# Patient Record
Sex: Female | Born: 1975
Health system: Southern US, Community
[De-identification: ages and names within clinical notes are randomized; demographics above are authoritative.]

## PROBLEM LIST (undated history)

## (undated) DIAGNOSIS — R011 Cardiac murmur, unspecified: Secondary | ICD-10-CM

## (undated) DIAGNOSIS — T7840XA Allergy, unspecified, initial encounter: Secondary | ICD-10-CM

## (undated) HISTORY — DX: Cardiac murmur, unspecified: R01.1

## (undated) HISTORY — PX: APPENDECTOMY: SHX54

## (undated) HISTORY — DX: Allergy, unspecified, initial encounter: T78.40XA

---

## 2001-10-24 ENCOUNTER — Other Ambulatory Visit: Admission: RE | Admit: 2001-10-24 | Discharge: 2001-10-24 | Payer: Self-pay | Admitting: Obstetrics and Gynecology

## 2002-08-21 ENCOUNTER — Emergency Department (HOSPITAL_COMMUNITY): Admission: EM | Admit: 2002-08-21 | Discharge: 2002-08-21 | Payer: Self-pay | Admitting: Emergency Medicine

## 2002-11-06 ENCOUNTER — Other Ambulatory Visit: Admission: RE | Admit: 2002-11-06 | Discharge: 2002-11-06 | Payer: Self-pay | Admitting: Obstetrics and Gynecology

## 2003-06-22 ENCOUNTER — Emergency Department (HOSPITAL_COMMUNITY): Admission: AD | Admit: 2003-06-22 | Discharge: 2003-06-22 | Payer: Self-pay | Admitting: Family Medicine

## 2003-08-17 ENCOUNTER — Encounter: Admission: RE | Admit: 2003-08-17 | Discharge: 2003-08-17 | Payer: Self-pay | Admitting: Internal Medicine

## 2003-11-25 ENCOUNTER — Other Ambulatory Visit: Admission: RE | Admit: 2003-11-25 | Discharge: 2003-11-25 | Payer: Self-pay | Admitting: Obstetrics and Gynecology

## 2004-05-30 ENCOUNTER — Ambulatory Visit: Payer: Self-pay | Admitting: Internal Medicine

## 2004-12-06 ENCOUNTER — Other Ambulatory Visit: Admission: RE | Admit: 2004-12-06 | Discharge: 2004-12-06 | Payer: Self-pay | Admitting: Obstetrics and Gynecology

## 2004-12-28 ENCOUNTER — Ambulatory Visit (HOSPITAL_COMMUNITY): Admission: RE | Admit: 2004-12-28 | Discharge: 2004-12-28 | Payer: Self-pay | Admitting: Obstetrics and Gynecology

## 2005-07-01 ENCOUNTER — Emergency Department (HOSPITAL_COMMUNITY): Admission: EM | Admit: 2005-07-01 | Discharge: 2005-07-01 | Payer: Self-pay | Admitting: Family Medicine

## 2005-10-23 ENCOUNTER — Ambulatory Visit: Payer: Self-pay | Admitting: Internal Medicine

## 2005-11-16 ENCOUNTER — Ambulatory Visit: Payer: Self-pay | Admitting: Internal Medicine

## 2006-06-22 ENCOUNTER — Ambulatory Visit: Payer: Self-pay | Admitting: Internal Medicine

## 2007-01-31 ENCOUNTER — Ambulatory Visit: Payer: Self-pay | Admitting: Obstetrics & Gynecology

## 2007-02-04 ENCOUNTER — Inpatient Hospital Stay (HOSPITAL_COMMUNITY): Admission: AD | Admit: 2007-02-04 | Discharge: 2007-02-04 | Payer: Self-pay | Admitting: Obstetrics and Gynecology

## 2007-02-04 ENCOUNTER — Ambulatory Visit: Payer: Self-pay | Admitting: Obstetrics and Gynecology

## 2007-02-08 ENCOUNTER — Ambulatory Visit (HOSPITAL_COMMUNITY): Admission: RE | Admit: 2007-02-08 | Discharge: 2007-02-08 | Payer: Self-pay | Admitting: Obstetrics and Gynecology

## 2007-02-08 ENCOUNTER — Ambulatory Visit: Payer: Self-pay | Admitting: Obstetrics and Gynecology

## 2007-02-11 ENCOUNTER — Ambulatory Visit: Payer: Self-pay | Admitting: Obstetrics and Gynecology

## 2007-02-15 ENCOUNTER — Ambulatory Visit (HOSPITAL_COMMUNITY): Admission: RE | Admit: 2007-02-15 | Discharge: 2007-02-15 | Payer: Self-pay | Admitting: Obstetrics and Gynecology

## 2007-02-15 ENCOUNTER — Ambulatory Visit: Payer: Self-pay | Admitting: Obstetrics and Gynecology

## 2007-02-19 ENCOUNTER — Ambulatory Visit: Payer: Self-pay | Admitting: Obstetrics and Gynecology

## 2007-02-22 ENCOUNTER — Ambulatory Visit (HOSPITAL_COMMUNITY): Admission: RE | Admit: 2007-02-22 | Discharge: 2007-02-22 | Payer: Self-pay | Admitting: Obstetrics and Gynecology

## 2007-02-22 ENCOUNTER — Ambulatory Visit: Payer: Self-pay | Admitting: Obstetrics & Gynecology

## 2007-02-25 ENCOUNTER — Ambulatory Visit (HOSPITAL_COMMUNITY): Admission: RE | Admit: 2007-02-25 | Discharge: 2007-02-25 | Payer: Self-pay | Admitting: Obstetrics and Gynecology

## 2007-02-25 ENCOUNTER — Ambulatory Visit: Payer: Self-pay | Admitting: Obstetrics and Gynecology

## 2007-03-01 ENCOUNTER — Ambulatory Visit (HOSPITAL_COMMUNITY): Admission: RE | Admit: 2007-03-01 | Discharge: 2007-03-01 | Payer: Self-pay | Admitting: Obstetrics and Gynecology

## 2007-03-01 ENCOUNTER — Ambulatory Visit: Payer: Self-pay | Admitting: Obstetrics and Gynecology

## 2007-03-06 ENCOUNTER — Encounter: Payer: Self-pay | Admitting: Obstetrics and Gynecology

## 2007-03-06 ENCOUNTER — Inpatient Hospital Stay (HOSPITAL_COMMUNITY): Admission: AD | Admit: 2007-03-06 | Discharge: 2007-03-06 | Payer: Self-pay | Admitting: Obstetrics and Gynecology

## 2007-03-07 ENCOUNTER — Inpatient Hospital Stay (HOSPITAL_COMMUNITY): Admission: AD | Admit: 2007-03-07 | Discharge: 2007-03-07 | Payer: Self-pay | Admitting: Obstetrics and Gynecology

## 2007-03-12 ENCOUNTER — Ambulatory Visit (HOSPITAL_COMMUNITY): Admission: RE | Admit: 2007-03-12 | Discharge: 2007-03-12 | Payer: Self-pay | Admitting: Obstetrics and Gynecology

## 2007-03-12 ENCOUNTER — Ambulatory Visit: Payer: Self-pay | Admitting: Obstetrics and Gynecology

## 2007-03-15 ENCOUNTER — Ambulatory Visit: Payer: Self-pay | Admitting: Obstetrics & Gynecology

## 2007-03-19 ENCOUNTER — Encounter: Payer: Self-pay | Admitting: Obstetrics and Gynecology

## 2007-03-19 ENCOUNTER — Ambulatory Visit: Payer: Self-pay | Admitting: Obstetrics & Gynecology

## 2007-03-20 ENCOUNTER — Inpatient Hospital Stay (HOSPITAL_COMMUNITY): Admission: AD | Admit: 2007-03-20 | Discharge: 2007-03-23 | Payer: Self-pay | Admitting: Obstetrics and Gynecology

## 2007-03-20 ENCOUNTER — Encounter (INDEPENDENT_AMBULATORY_CARE_PROVIDER_SITE_OTHER): Payer: Self-pay | Admitting: Obstetrics and Gynecology

## 2007-03-24 ENCOUNTER — Encounter: Admission: RE | Admit: 2007-03-24 | Discharge: 2007-04-20 | Payer: Self-pay | Admitting: Obstetrics and Gynecology

## 2008-05-04 IMAGING — US US OB LIMITED
1 series · 14 of 24 positions shown · non-contrast
Comparison: none

OBSTETRICAL ULTRASOUND:
 This ultrasound was performed in The [HOSPITAL], and the [facility] report will be stored to [REDACTED] PACS.

[Series 1: us ob limited · 14 of 24 slices shown]
[im 1/24]
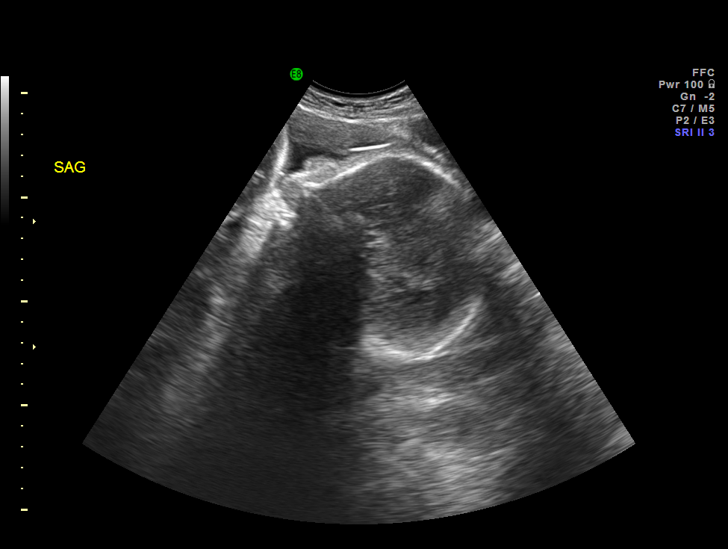
[im 3/24]
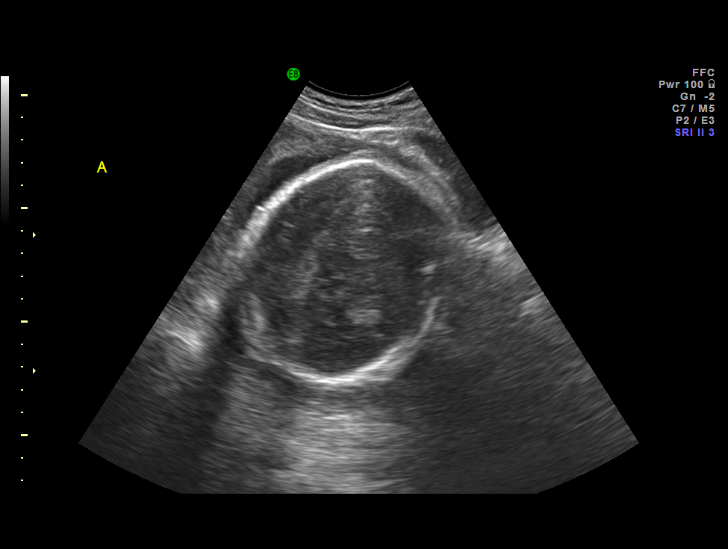
[im 5/24]
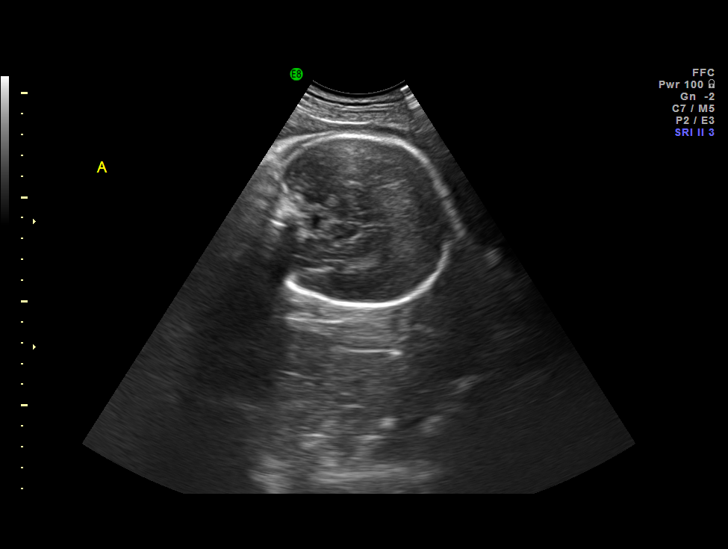
[im 7/24]
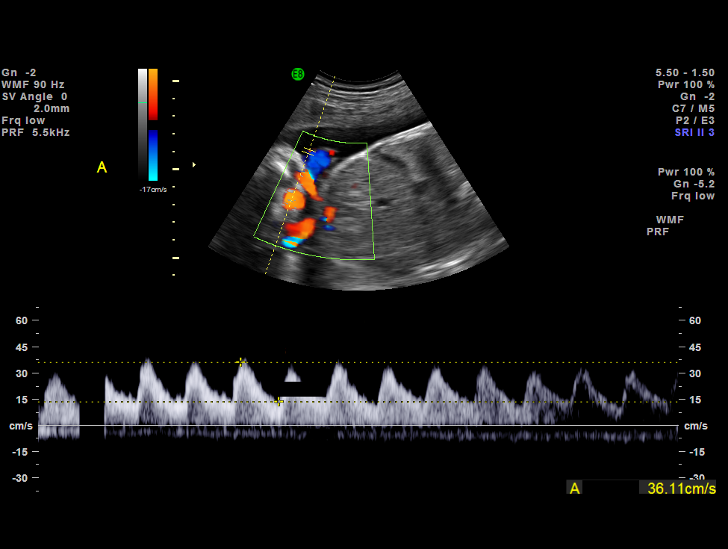
[im 8/24]
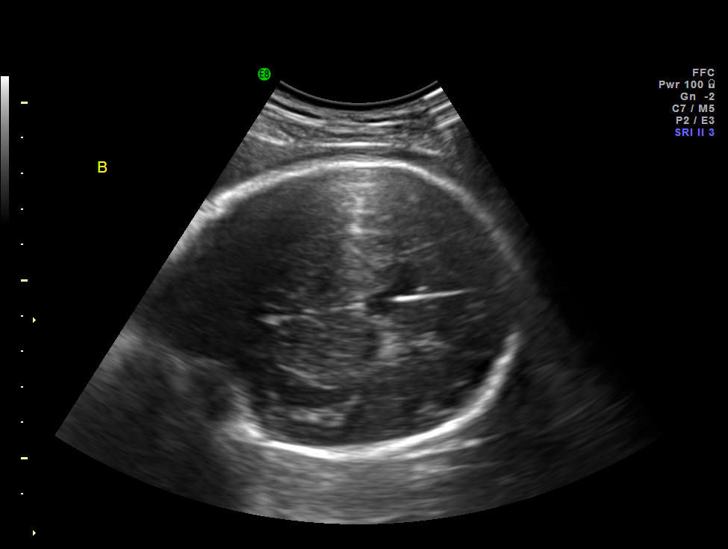
[im 10/24]
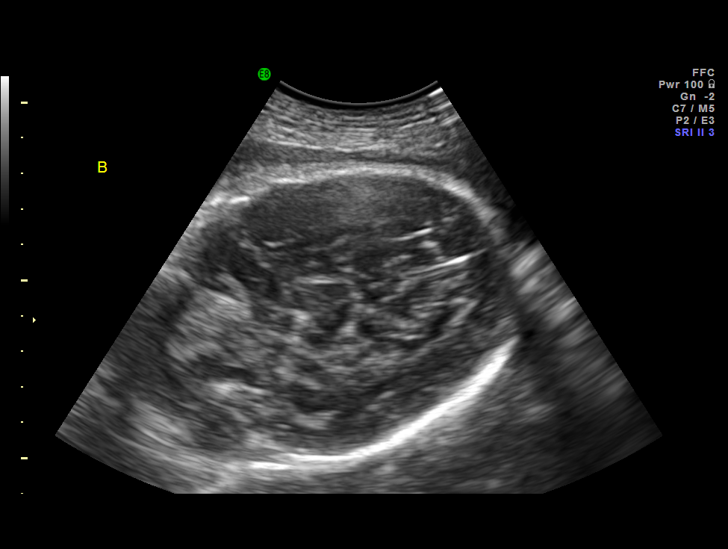
[im 12/24]
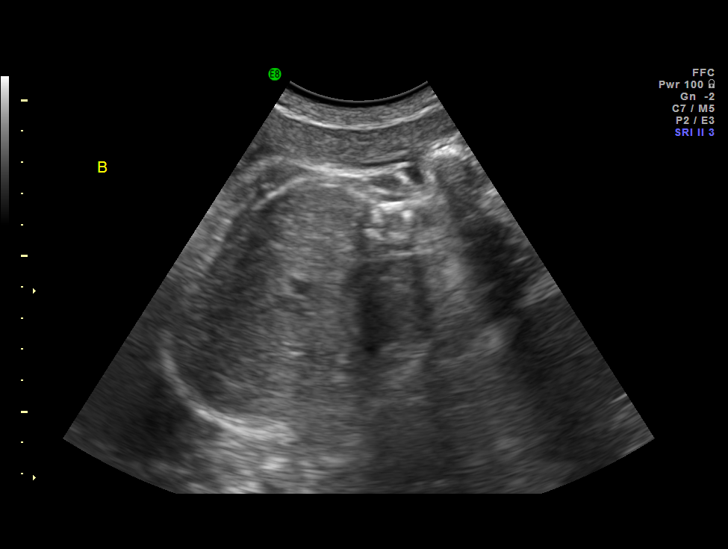
[im 13/24]
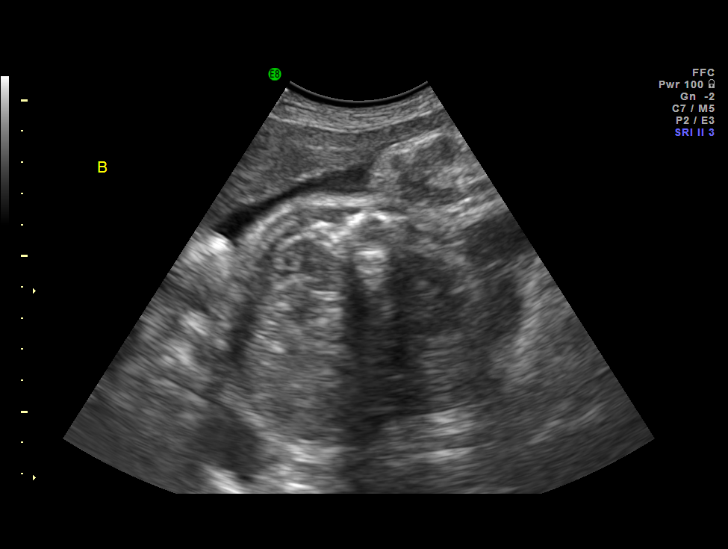
[im 15/24]
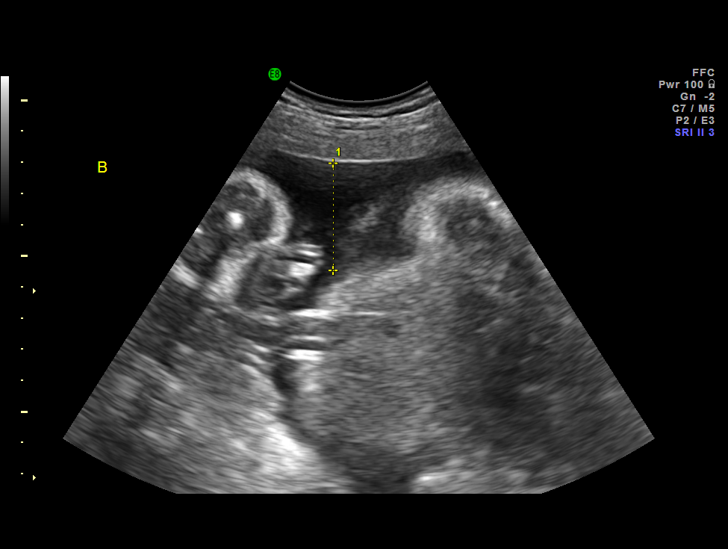
[im 17/24]
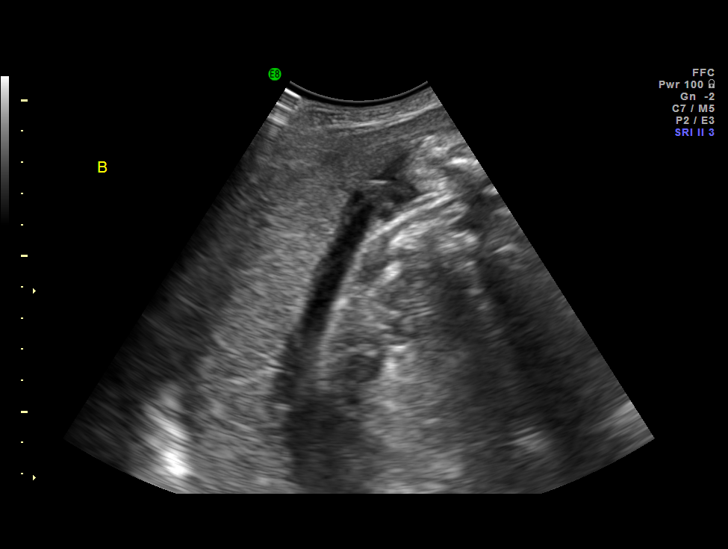
[im 19/24]
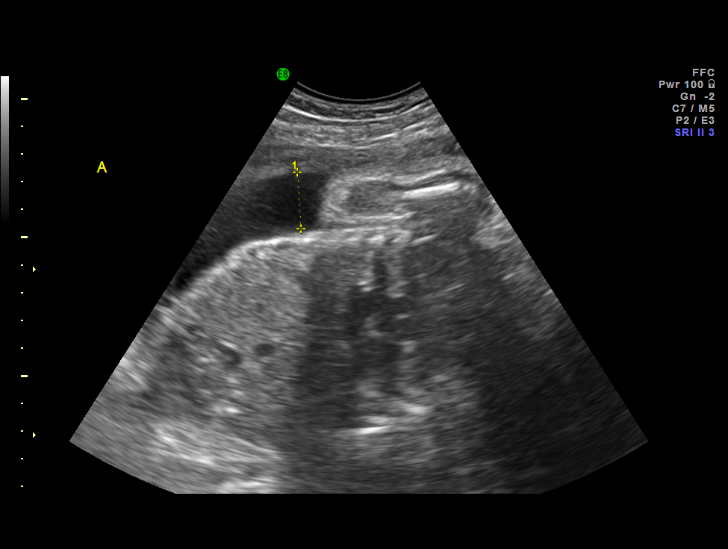
[im 20/24]
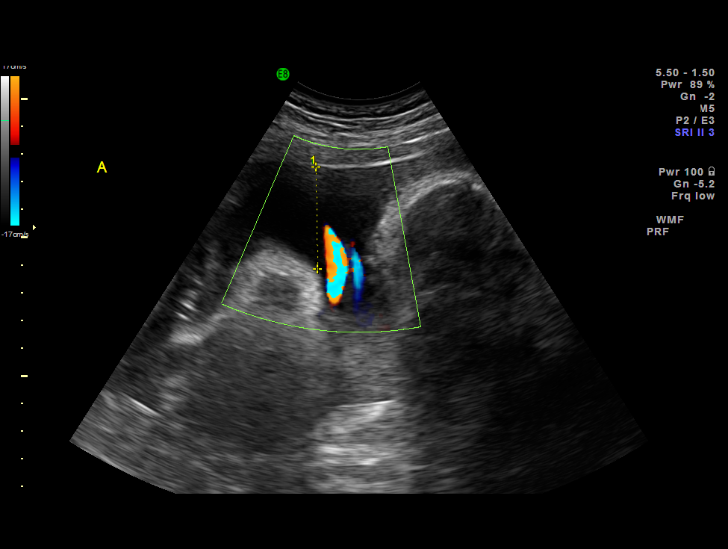
[im 22/24]
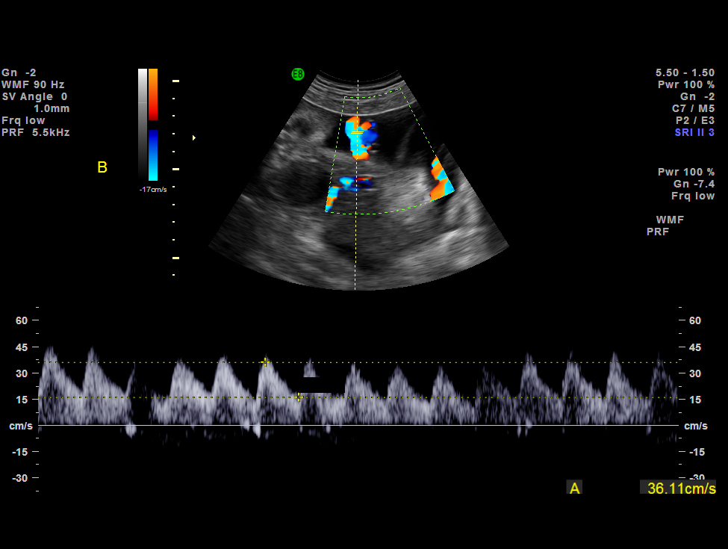
[im 24/24]
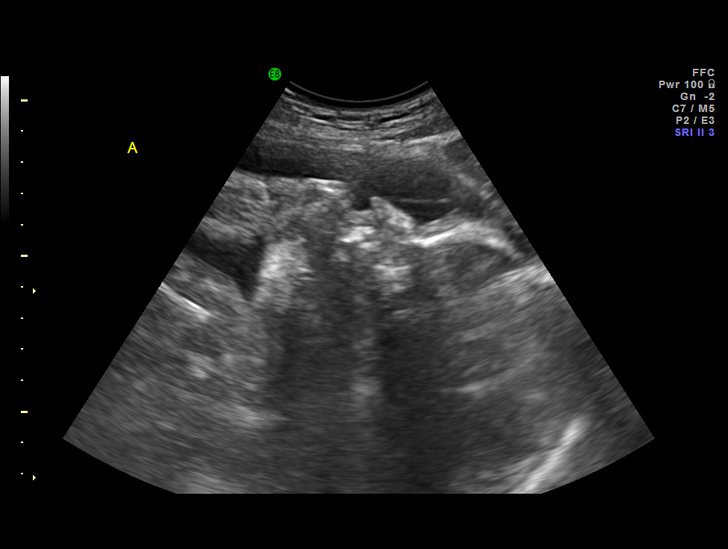

[14 of 24 positions shown; findings below may reference images not displayed]

IMPRESSION: The [facility] report has also been faxed to the ordering physician.

## 2009-04-15 ENCOUNTER — Inpatient Hospital Stay (HOSPITAL_COMMUNITY): Admission: AD | Admit: 2009-04-15 | Discharge: 2009-04-17 | Payer: Self-pay | Admitting: Obstetrics and Gynecology

## 2010-03-02 ENCOUNTER — Ambulatory Visit: Payer: Self-pay | Admitting: Family Medicine

## 2010-03-03 LAB — CONVERTED CEMR LAB
AST: 18 units/L (ref 0–37)
Albumin: 3.9 g/dL (ref 3.5–5.2)
Alkaline Phosphatase: 45 units/L (ref 39–117)
BUN: 10 mg/dL (ref 6–23)
Basophils Absolute: 0.1 10*3/uL (ref 0.0–0.1)
CO2: 23 meq/L (ref 19–32)
Calcium: 8.9 mg/dL (ref 8.4–10.5)
Cholesterol: 147 mg/dL (ref 0–200)
Creatinine, Ser: 0.7 mg/dL (ref 0.4–1.2)
Eosinophils Absolute: 0 10*3/uL (ref 0.0–0.7)
GFR calc non Af Amer: 109.62 mL/min (ref >60)
Glucose, Bld: 76 mg/dL (ref 70–99)
HDL: 59.9 mg/dL (ref >39.00)
Hemoglobin: 11.6 g/dL — ABNORMAL LOW (ref 12.0–15.0)
Lymphocytes Relative: 32.1 % (ref 12.0–46.0)
MCHC: 34.7 g/dL (ref 30.0–36.0)
Monocytes Relative: 9.2 % (ref 3.0–12.0)
Neutro Abs: 2.8 10*3/uL (ref 1.4–7.7)
Neutrophils Relative %: 57.3 % (ref 43.0–77.0)
Platelets: 221 10*3/uL (ref 150.0–400.0)
RDW: 12.2 % (ref 11.5–14.6)
Sodium: 137 meq/L (ref 135–145)
Total Bilirubin: 0.5 mg/dL (ref 0.3–1.2)
Triglycerides: 54 mg/dL (ref 0.0–149.0)
VLDL: 10.8 mg/dL (ref 0.0–40.0)

## 2010-08-09 NOTE — Assessment & Plan Note (Signed)
Summary: RE-ESTABLISH HERE/CPX/FASTING//KN   Vital Signs:  Patient profile:   35 year old female Height:      63.50 inches Weight:      127 pounds BMI:     22.22 Pulse rate:   80 / minute BP sitting:   108 / 60  (left arm)  Vitals Entered By: Doristine Devoid CMA (March 02, 2010 9:55 AM) CC: NEW EST- CPX AND LABS  Comments currently 7 weeks preg.   History of Present Illness: 35 yo woman here today to establish care.  would like a physical.  no questions or concerns.  [redacted] weeks pregnant ([facility]- [facility])  Preventive Screening-Counseling & Management  Alcohol-Tobacco     Alcohol drinks/day: 0     Smoking Status: never  Caffeine-Diet-Exercise     Does Patient Exercise: yes     Type of exercise: tennis      Sexual History:  currently monogamous.        Drug Use:  never.    Current Medications (verified): 1)  Multivitamins  Tabs (Multiple Vitamin) .... Take One Tablet Daily  Allergies (verified): 1)  ! Penicillin  Past History:  Past Medical History: none reported   Past Surgical History: Appendectomy Caesarean section x1  Past History:  Care Management: OB/Gyn: Dr. Derrel Nip  Family History: Family History Lung cancer- paternal GM, maternal GF Family History Other cancer-Colon-paternal grandfather Family History Seizures- brother w/ epilepsy HTN-mother no CAD, MI, CVA no DM no breast cancer  Social History: Never Smoked Alcohol use-yes-socially pregnant w/ 4th child- twins in 2008, 2010 married works for [employer] 2 newsDoes Patient Exercise:  yes Sexual History:  currently monogamous Drug Use:  never  Review of Systems  The patient denies anorexia, fever, weight loss, weight gain, vision loss, decreased hearing, hoarseness, chest pain, syncope, dyspnea on exertion, peripheral edema, prolonged cough, headaches, abdominal pain, melena, hematochezia, severe indigestion/heartburn, hematuria, suspicious skin lesions, depression, abnormal bleeding, enlarged  lymph nodes, and breast masses.    Physical Exam  General:  Well-developed,well-nourished,in no acute distress; alert,appropriate and cooperative throughout examination Head:  Normocephalic and atraumatic without obvious abnormalities. No apparent alopecia or balding. Eyes:  No corneal or conjunctival inflammation noted. EOMI. Perrla. Funduscopic exam benign, without hemorrhages, exudates or papilledema. Vision grossly normal. Ears:  External ear exam shows no significant lesions or deformities.  Otoscopic examination reveals clear canals, tympanic membranes are intact bilaterally without bulging, retraction, inflammation or discharge. Hearing is grossly normal bilaterally. Nose:  External nasal examination shows no deformity or inflammation. Nasal mucosa are pink and moist without lesions or exudates. Mouth:  Oral mucosa and oropharynx without lesions or exudates.  Teeth in good repair. Neck:  No deformities, masses, or tenderness noted. Breasts:  deferred to gyn Lungs:  Normal respiratory effort, chest expands symmetrically. Lungs are clear to auscultation, no crackles or wheezes. Heart:  Normal rate and regular rhythm. S1 and S2 normal without gallop, murmur, click, rub or other extra sounds. Abdomen:  Bowel sounds positive,abdomen soft and non-tender without masses, organomegaly or hernias noted. Genitalia:  deferred to gyn Msk:  No deformity or scoliosis noted of thoracic or lumbar spine.   Pulses:  +2 carotid, radial, DP Extremities:  No clubbing, cyanosis, edema, or deformity noted with normal full range of motion of all joints.   Neurologic:  No cranial nerve deficits noted. Station and gait are normal. Plantar reflexes are down-going bilaterally. DTRs are symmetrical throughout. Sensory, motor and coordinative functions appear intact. Skin:  Intact without suspicious lesions or  rashes Cervical Nodes:  No lymphadenopathy noted Psych:  Cognition and judgment appear intact. Alert and  cooperative with normal attention span and concentration. No apparent delusions, illusions, hallucinations   Impression & Recommendations:  Problem # 1:  PHYSICAL EXAMINATION (ICD-V70.0) Assessment New pt's PE WNL.  check labs.  anticipatory guidance provided. Orders: Venipuncture (21308) TLB-Lipid Panel (80061-LIPID) TLB-BMP (Basic Metabolic Panel-BMET) (80048-METABOL) TLB-CBC Platelet - w/Differential (85025-CBCD) TLB-Hepatic/Liver Function Pnl (80076-HEPATIC) TLB-TSH (Thyroid Stimulating Hormone) (84443-TSH) Specimen Handling (65784)  Complete Medication List: 1)  Multivitamins Tabs (Multiple vitamin) .... Take one tablet daily  Patient Instructions: 1)  Follow up in 1 year or as needed 2)  We'll notify you of your lab results 3)  Call with any questions or concerns 4)  Welcome!  We're glad to have you!

## 2010-10-13 LAB — CBC
HCT: 32.3 % — ABNORMAL LOW (ref 36.0–46.0)
HCT: 32.5 % — ABNORMAL LOW (ref 36.0–46.0)
Hemoglobin: 11.3 g/dL — ABNORMAL LOW (ref 12.0–15.0)
MCHC: 34.5 g/dL (ref 30.0–36.0)
MCHC: 34.7 g/dL (ref 30.0–36.0)
MCV: 97.8 fL (ref 78.0–100.0)
RBC: 3.3 MIL/uL — ABNORMAL LOW (ref 3.87–5.11)
RBC: 3.33 MIL/uL — ABNORMAL LOW (ref 3.87–5.11)
WBC: 9.9 10*3/uL (ref 4.0–10.5)

## 2010-10-18 ENCOUNTER — Inpatient Hospital Stay (HOSPITAL_COMMUNITY)
Admission: AD | Admit: 2010-10-18 | Discharge: 2010-10-20 | DRG: 775 | Disposition: A | Discharge status: Home / Self Care | Payer: 59 | Source: Ambulatory Visit | Attending: Obstetrics and Gynecology | Admitting: Obstetrics and Gynecology

## 2010-10-18 DIAGNOSIS — O09529 Supervision of elderly multigravida, unspecified trimester: Secondary | ICD-10-CM | POA: Diagnosis present

## 2010-10-18 DIAGNOSIS — O34219 Maternal care for unspecified type scar from previous cesarean delivery: Secondary | ICD-10-CM | POA: Diagnosis present

## 2010-10-18 LAB — CBC
HCT: 35.6 % — ABNORMAL LOW (ref 36.0–46.0)
MCHC: 34.8 g/dL (ref 30.0–36.0)
MCV: 93.9 fL (ref 78.0–100.0)
Platelets: 238 10*3/uL (ref 150–400)
RDW: 12.9 % (ref 11.5–15.5)

## 2010-10-19 LAB — CBC
HCT: 31.1 % — ABNORMAL LOW (ref 36.0–46.0)
Hemoglobin: 10.7 g/dL — ABNORMAL LOW (ref 12.0–15.0)
MCV: 94.8 fL (ref 78.0–100.0)

## 2010-10-22 ENCOUNTER — Inpatient Hospital Stay (HOSPITAL_COMMUNITY): Admission: AD | Admit: 2010-10-22 | Payer: Self-pay | Admitting: Obstetrics and Gynecology

## 2010-10-26 NOTE — Discharge Summary (Signed)
Michaela Castillo, Michaela Castillo            ACCOUNT NO.:  [id number]  MEDICAL RECORD NO.:  [id number]           PATIENT TYPE:  I  LOCATION:  9110                          [facility]ILITY:  [facility]  PHYSICIAN:  Sherron Monday, MD        DATE OF BIRTH:  1976/03/23  DATE OF ADMISSION:  10/18/2010 DATE OF DISCHARGE:  10/20/2010                              DISCHARGE SUMMARY   ADMITTING DIAGNOSIS:  Intrauterine pregnancy at term, in labor.  DISCHARGE DIAGNOSIS:  Intrauterine pregnancy at term, in labor, delivered.  HISTORY OF PRESENT ILLNESS:  A 35 year old G3, P1-1-0-3 at 39 plus weeks in labor, presents with 6 cm.  She states she had good fetal movement, no loss of fluid, no vaginal bleeding, contractions increasing in intensity and frequency.  Pregnancy is complicated by AMA.  She does have a history of a low-transverse cesarean section followed by successful VBAC, desiring another.  She had shingles in this pregnancy. Her IUP is dated by first trimester ultrasound with an [facility] of October 22, 2010.  PAST MEDICAL HISTORY:  Significant for asthma.  PAST SURGICAL HISTORY:  Significant for a cesarean section and an appendectomy.  PAST OB/GYN HISTORY:  G1 was an IVF twin pregnancy at 34-week C-section of 2 boys.  G2 was a VBAC, spontaneous pregnancy of a female infant.  G3 is the present.  No history of any abnormal Pap or sexually transmitted diseases.  MEDICATIONS:  Include prenatal vitamins.  ALLERGIES:  PENICILLIN.  No latex allergies.  SOCIAL HISTORY:  Denies alcohol, tobacco, or drug use.  She is married and reporter.  FAMILY HISTORY:  Significant for anxiety, hypertension, prostate cancer, and thyroid disease.  PRENATAL LABS:  She is A negative, antibody screen negative, hemoglobin 12.3, Pap smear within normal limits, rubella immune, RPR nonreactive, urine culture negative, hepatitis B surface antigen negative, HIV negative, gonorrhea negative, Chlamydia negative, platelets 315,000, cystic  fibrosis screen negative, first trimester screen within normal limits, AFP within normal limits, Glucola 147 with a 3-hour GTT within normal limits, group B strep was negative.  Ultrasound on June 07, 2010, revealed normal anatomy except limited heart and face, posterior placenta.  Ultrasound in December 2011, complete anatomy and it was normal.  On admission, afebrile with vital signs stable.  Heart tones were in the 130s and reactive with contractions every 4 minutes.  Her exam was 6-cm dilated.  We briefly discussed her desire for VBAC.  Epidural was planned for comfort.  She is group B strep negative expect successful VBAC.  The patient progressed rapidly to complete, complete, +2.  She was unable to get her epidural.  She had pressure.  She rapidly progressed and pushed to deliver a viable female infant at 1830, weight 6 pounds and 10 ounces with Apgars of 9 at 1 and 9 at 5.  She was delivered by Sanda Klein of [facility] midwife on-call who is in the OR and was unable to finish the case.  Placenta was expressed intact after cord blood collection.  I was present for the laceration repair.  She has second-degree laceration with 3-0 Vicryl Rapide under local block, also bilateral abrasions that  were not repaired.  EBL was approximately less than 500 mL.  Postpartum course was relatively uncomplicated.  Her hemoglobin decreased from 12.4 to 10.7.  On the day of discharge, she was ambulating, voiding, tolerating a diet, normal lochia, pain was well controlled.  She was discharged home with routine discharge instructions and numbers to call with any questions or problems.  Her blood type is A negative.  She is rubella immune.  She plans to breast-feed.  We will discuss contraception further at 6-week checkup.  She believes her husband is getting vasectomy.  Hemoglobin 12.4 to 10.7.  She was discharged home with routine discharge instructions and numbers to call with any questions or  problems.     Sherron Monday, MD     JB/MEDQ  D:  10/20/2010  T:  10/20/2010  Job:  [id number]  Electronically Signed by Sherron Monday MD on 10/26/2010 04:33:02 PM

## 2010-11-22 NOTE — H&P (Signed)
Michaela Castillo, Michaela Castillo            ACCOUNT NO.:  [id number]   MEDICAL RECORD NO.:  [id number]          PATIENT TYPE:  INP   LOCATION:                                [facility]   PHYSICIAN:  Zenaida Niece, M.D.DATE OF BIRTH:  [birth year]   DATE OF ADMISSION:  03/19/2007  DATE OF DISCHARGE:                              HISTORY & PHYSICAL   CHIEF COMPLAINT:  Twin pregnancy at 33 weeks with decel in baby B.   HISTORY OF PRESENT ILLNESS:  This is a 35 year old, white female gravida  1, para 0 with a twin intrauterine pregnancy with an EGA of 38 and 6/7  weeks by in vitro fertilization who is being monitored for discordant  growth of the twins.  On her most recent ultrasound which was performed  on August 23, baby A weighs 1565 gm, but the abdominal circumference was  9th percentile, and the femur length was less than 3rd percentile.  Normal amniotic fluid volume.  Baby B weighed 1876 gm with normal fetal  indices.  Both babies had biophysical profiles of 8/8, and this was a  17% discordance in the weight.  She has been followed for discordance  between the twins with NSTs twice a week and umbilical artery Dopplers  once a week.  The Dopplers have been normal, and the NST occasionally  shows a decel in baby B.  On her NST on the day of admission, baby B had  one late appearing decel with an otherwise reactive tracing.  When she  went to see Dr. Ander Slade for her biophysical profiles and umbilical artery  Dopplers, he was concerned enough about this decel that he wanted to  admit her for a 23-hour obs for continued fetal monitoring.  Prenatal  care has been otherwise uncomplicated.  Again, she did conceive by in  vitro fertilization and has a due date of October 22.   PAST MEDICAL HISTORY:  Significant for a history of exercise induced  asthma.   PAST SURGICAL HISTORY:  Appendectomy.   ALLERGIES:  PENICILLIN CAUSES HIVES.   MEDICATIONS:  Baby aspirin daily.   SOCIAL HISTORY:  She is  married and denies alcohol, tobacco, or drug  use.   FAMILY HISTORY:  Noncontributory.   REVIEW OF SYSTEMS:  She has normal complaints of pregnancy.   PHYSICAL EXAMINATION:  VITAL SIGNS:  Last weight in the office  approximately 170 pounds.  Vitals were stable.  HEART:  Regular rate and rhythm without murmur.  LUNGS:  Clear to auscultation.  ABDOMEN:  Gravid with a twin pregnancy.  EXTREMITIES:  Normal pregnancy edema.  CERVICAL:  Deferred.   ASSESSMENT:  Twin intrauterine pregnancy at 33+ weeks with discordant  growth and a possible late decel with baby B.  Again, this has been  discussed with Dr. Ander Slade, and he wishes to admit her for 23-hour  observation for  continued observation of the fetal heart rate tracings.  She is being  admitted for this at this time.  We will monitor her overnight, review  the tracing, and decide on any further course of action.  Also of  note  on her biophysical profile performed on September 9, the babies are in  the vertex-vertex presentation.      Zenaida Niece, M.D.  Electronically Signed     TDM/MEDQ  D:  03/20/2007  T:  03/21/2007  Job:  [id number]

## 2010-11-22 NOTE — Op Note (Signed)
Castillo, Michaela            ACCOUNT NO.:  [id number]   MEDICAL RECORD NO.:  [id number]          PATIENT TYPE:  INP   LOCATION:                                [facility]   PHYSICIAN:  Leighton Roach [facility], M.D.DATE OF BIRTH:  1975/10/01   DATE OF PROCEDURE:  03/20/2007  DATE OF DISCHARGE:                               OPERATIVE REPORT   PREOPERATIVE DIAGNOSIS:  Intrauterine pregnancy at 34 weeks with  nonreassuring fetal heart tracing of baby B.   POSTOPERATIVE DIAGNOSIS:  Intrauterine pregnancy at 34 weeks with  nonreassuring fetal heart tracing of baby B.   OPERATION PERFORMED:  Primary low transverse cesarean section without  extensions.   SURGEON:  Zenaida Niece, M.D.   ASSISTANT:  Huel Cote, M.D.   ANESTHESIA:  Spinal.   SPECIMENS:  Placenta sent to pathology.   ESTIMATED BLOOD LOSS:  1000 mL.   COMPLICATIONS:  None.   FINDINGS:  The patient had normal gravid anatomy and delivered twins in  the vertex/vertex presentation.  Baby A was a viable female with Apgars of  5 and 7 and weight 1730 g.  Baby B was a viable female Apgars of 5 and 7  that weighed 1979 g and had a nuchal cord x1 and an arterial cord pH of  7.10.   DESCRIPTION OF PROCEDURE:  The patient was taken to the operating room  and placed in the sitting position.  Burnett Corrente, M.D. instilled  spinal anesthesia and she was then placed in dorsal supine position with  left lateral tilt.  Abdomen was prepped and draped in the usual sterile  fashion and a Foley catheter inserted.  The level of her anesthesia was  found to be adequate and abdomen was entered via a standard Pfannenstiel  incision.  The Alexis disposable retractor was placed and the lower  uterine segment was well exposed.  A 4 cm transverse incision was then  made in the lower uterine segment pushing the bladder inferior.  Once  the amniotic cavity was entered, the incision was extended digitally.  Baby A was vertex and the  vertex was grasped and delivered through the  incision atraumatically.  The mouth and nares were suctioned and the  remainder of the infant delivered atraumatically.  The cord was doubly  clamped and cut and the infant handed to the waiting pediatric team.  Membranes of baby B were then ruptured revealing clear fluid.  Baby B  was in a vertex presentation.  The vertex was delivered and the mouth  and nares were suctioned.  A loose nuchal cord x1 was reduced.  The  remainder of the infant then delivered atraumatically.  The cord was  doubly clamped and cut and the infant handed to the waiting pediatric  team.  Cord blood was then obtained on baby A and a cord clamp was  placed on that cord.  Arterial cord pH was obtained from baby B as well  as cord blood.  Placentas were then removed manually.  Uterus was wiped  dry with a clean lap pad and all clots and debris removed.  The uterine  incision was inspected and found to be free of extensions.  Uterine  incision was then closed in two layers with the first layer being a  running locking layer with #1 chromic and the second layer being an  imbricating layer also with #1 chromic.  Small amount of bleeding from  the middle right was controlled with a figure-of-eight suture of 3-0  Vicryl.  Tubes and ovaries were inspected and found to be normal.  Uterine incision was again inspected and found to be hemostatic.  Bleeding from serosal edges was controlled with electrocautery.  Subfascial space was then irrigated and made hemostatic with  electrocautery.  Fascia was closed in running fashion starting at both  ends and meeting in the middle with 0 Vicryl.  Subcutaneous tissue was  then irrigated and made hemostatic with electrocautery.  Skin was then  closed with a running subcuticular suture of 4-0 Prolene followed by  Steri-Strips and sterile dressing.  The patient tolerated the procedure  well and was taken to the recovery room in stable  condition.  Counts  were correct x2 and she received Ancef 1 g IV after cord clamp.      Zenaida Niece, M.D.  Electronically Signed     TDM/MEDQ  D:  03/20/2007  T:  03/21/2007  Job:  [id number]

## 2010-11-25 NOTE — Letter (Signed)
May 24, 2006    [facility]  [address] [place], [address]  [place], [place]  [address]  Fax # [phone]   RE:  CHELA, SUTPHEN  MRN:  [id number]  /  DOB:  1976/05/02   Michaela Castillo has been a patient at [facility] since  August 04, 2003.  She was seen initially for complete physical examination;  please see summary dictation of Februaty 17, 2005. There were no acute  findings although she did have a carotid bruit suggestive of possible  subclavian steal.   Subsequently she has been seen for respiratory tract infections including  bronchitis and rhinosinusitis on May 03, 2004 and May 30, 2004.   She received MMR injection on April 16 but was not seen.   She was most recently seen Nov 16, 2005 for laryngitis and bronchitis.   Lab abnormalities have revealed a mild hypokalemia of  3.3.  Her total  cholesterol has been mildly elevated at 204 and  LDL  109.  This was not  felt to be significant as her HDL is 65.  This will be monitored  periodically.   A copy of this report will be sent to the [facility] where she will be evaluated. Titus Dubin. Hopper M.D., FACP, [facility]    Sincerely,      Titus Dubin. Alwyn Ren, MD,FACP,FCCP  Electronically Signed    [facility]/MedQ  DD: 05/24/2006  DT: 05/24/2006  Job #: [id number]

## 2010-11-25 NOTE — Discharge Summary (Signed)
Michaela Castillo, Michaela Castillo            ACCOUNT NO.:  [id number]   MEDICAL RECORD NO.:  [id number]          PATIENT TYPE:  INP   LOCATION:                                [facility]   PHYSICIAN:  Zenaida Niece, M.D.DATE OF BIRTH:  February 24, 1976   DATE OF ADMISSION:  03/19/2007  DATE OF DISCHARGE:  03/23/2007                               DISCHARGE SUMMARY   ADMISSION DIAGNOSES:  Twin intrauterine pregnancy at 33 weeks with  nonreassuring fetal heart tracing.   DISCHARGE DIAGNOSES:  Twin intrauterine pregnancy at 33 weeks with  nonreassuring fetal heart tracing.   PROCEDURES:  On March 20, 2007 she underwent a primary low  transverse cesarean section.   HISTORY AND PHYSICAL:  Please see the chart for full history and  physical.  Briefly, this is a 35 year old white female gravida 1, para 0  with a twin intrauterine pregnancy at 33-6/7 weeks by in vitro  fertilization.  She is being followed for discordant growth.  Baby A is  the smaller baby; Baby B is the bigger baby.   On routine monitoring on the day of admission, Baby B had a prolonged  decelerations.  So, she is being admitted for prolonged monitoring.   PHYSICAL EXAMINATION:  Significant for gravid abdomen with a twin  pregnancy, and is otherwise unremarkable.   HOSPITAL COURSE:  She was admitted and put on continuous monitoring.  Baby B, which again is the bigger baby, did continue to have some  occasional late decelerations with a long contraction; but both babies  were reactive.  I had Dr. Margot Ables review the tracings, and he  recommended a contraction stress test.  A contraction stress test was  performed and baby B had repetitive decelerations.  The situation was  discussed with the patient and her husband, and the recommendation was  to proceed with cesarean section and they agreed.  She is 34 weeks on  the day of delivery.  So, on September 10 she underwent a primary low  transverse cesarean section for a  nonreassuring fetal heart tracing of  Baby B.  She delivered two viable males, baby A with Apgars of 5 and 7;  weighing 1737 grams . Baby B with Apgars of 5 and 7 weighing 1979 grams.  Baby B had an arterial cord pH of 7.10.  Estimated blood loss was 1000  mL and anatomy was otherwise normal.   Postoperatively she had no significant complications.  Preoperative  hemoglobin 11.9, postoperative is 8.8 and then down to 8.5.  Again, she  had no significant complications and on postoperative day #3 was felt to  be stable enough for discharge home.  The babies did well in the NICU.   Her incision was healing well and her Prolene subcuticular suture was  removed prior to discharge.   DISCHARGE INSTRUCTIONS:  Regular diet, pelvic rest, no strenuous  activity.  Follow-up is in 2 weeks for an incision check.  Medications  are over-the-counter ibuprofen as needed and she is given our discharge  pamphlet.      Zenaida Niece, M.D.  Electronically Signed  TDM/MEDQ  D:  04/24/2007  T:  04/24/2007  Job:  [id number]

## 2011-04-21 LAB — CBC
HCT: 25.1 — ABNORMAL LOW
HCT: 34 — ABNORMAL LOW
Hemoglobin: 11.9 — ABNORMAL LOW
Hemoglobin: 8.8 — ABNORMAL LOW
MCHC: 35
MCHC: 35.3
MCV: 96.4
MCV: 97
Platelets: 100 — ABNORMAL LOW
Platelets: 131 — ABNORMAL LOW
RBC: 3.51 — ABNORMAL LOW
RDW: 13.1
RDW: 13.9
WBC: 8.9

## 2011-04-24 LAB — RH IMMUNE GLOBULIN WORKUP (NOT WOMEN'S HOSP): Antibody Screen: NEGATIVE

## 2015-07-22 ENCOUNTER — Other Ambulatory Visit (HOSPITAL_COMMUNITY): Payer: Self-pay | Admitting: Respiratory Therapy

## 2015-07-22 DIAGNOSIS — R0600 Dyspnea, unspecified: Secondary | ICD-10-CM

## 2015-07-29 ENCOUNTER — Ambulatory Visit (HOSPITAL_COMMUNITY)
Admission: RE | Admit: 2015-07-29 | Discharge: 2015-07-29 | Disposition: A | Discharge status: Home / Self Care | Payer: BLUE CROSS/BLUE SHIELD | Source: Ambulatory Visit | Attending: Internal Medicine | Admitting: Internal Medicine

## 2015-07-29 DIAGNOSIS — R0600 Dyspnea, unspecified: Secondary | ICD-10-CM | POA: Diagnosis present

## 2015-07-29 LAB — PULMONARY FUNCTION TEST
DL/VA % pred: 81 %
DL/VA: 3.92 ml/min/mmHg/L
DLCO UNC: 20.11 ml/min/mmHg
DLCO unc % pred: 82 %
FEF 25-75 POST: 3.42 L/s
FEF 25-75 Pre: 3.48 L/sec
FEF2575-%Change-Post: -1 %
FEF2575-%Pred-Post: 111 %
FEF2575-%Pred-Pre: 113 %
FEV1-%CHANGE-POST: 0 %
FEV1-%PRED-POST: 122 %
FEV1-%PRED-PRE: 122 %
FEV1-POST: 3.68 L
FEV1-PRE: 3.67 L
FEV1FVC-%Change-Post: 3 %
FEV1FVC-%PRED-PRE: 98 %
FEV6-%Change-Post: -3 %
FEV6-%PRED-POST: 120 %
FEV6-%PRED-PRE: 125 %
FEV6-POST: 4.36 L
FEV6-Pre: 4.53 L
FEV6FVC-%CHANGE-POST: 0 %
FEV6FVC-%PRED-POST: 102 %
FEV6FVC-%PRED-PRE: 101 %
FVC-%Change-Post: -3 %
FVC-%PRED-PRE: 123 %
FVC-%Pred-Post: 119 %
FVC-POST: 4.39 L
FVC-Pre: 4.54 L
POST FEV6/FVC RATIO: 100 %
PRE FEV6/FVC RATIO: 100 %
Post FEV1/FVC ratio: 84 %
Pre FEV1/FVC ratio: 81 %
RV % PRED: 79 %
RV: 1.29 L
TLC % PRED: 110 %
TLC: 5.57 L

## 2015-07-29 MED ORDER — ALBUTEROL SULFATE (2.5 MG/3ML) 0.083% IN NEBU
2.5000 mg | INHALATION_SOLUTION | Freq: Once | RESPIRATORY_TRACT | Status: AC
  Administered 2015-07-29: 2.5 mg via RESPIRATORY_TRACT

## 2016-01-02 ENCOUNTER — Encounter (HOSPITAL_COMMUNITY): Payer: Self-pay | Admitting: Emergency Medicine

## 2016-01-02 ENCOUNTER — Ambulatory Visit (HOSPITAL_COMMUNITY)
Admission: EM | Admit: 2016-01-02 | Discharge: 2016-01-02 | Disposition: A | Discharge status: Home / Self Care | Payer: BLUE CROSS/BLUE SHIELD | Attending: Emergency Medicine | Admitting: Emergency Medicine

## 2016-01-02 DIAGNOSIS — H6591 Unspecified nonsuppurative otitis media, right ear: Secondary | ICD-10-CM | POA: Diagnosis not present

## 2016-01-02 MED ORDER — AZITHROMYCIN 250 MG PO TABS: ORAL_TABLET | ORAL | Status: DC

## 2016-01-02 NOTE — ED Notes (Signed)
Patient has bilateral ear pain, but the right ear started hurting initially and has hurt the most.  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  Started ciprodex on Tuesday or Wednesday, but has not seen the improvement she normally sees with ear drops and ibuprofen.  Patient has not noticed any additional allergy symtoms, has chronic allergy issues.  Denies fever, denies sore throat

## 2016-01-02 NOTE — Discharge Instructions (Signed)
Take antibiotic as directed until complete. Continue the OTC drops for swimmer's ear and take Benadryl 25-50 mg when at home for fluid buildup in the ear.   Otitis Media, Adult Otitis media is redness, soreness, and puffiness (swelling) in the space just behind your eardrum (middle ear). It may be caused by allergies or infection. It often happens along with a cold. HOME CARE  Take your medicine as told. Finish it even if you start to feel better.  Only take over-the-counter or prescription medicines for pain, discomfort, or fever as told by your doctor.  Follow up with your doctor as told. GET HELP IF:  You have otitis media only in one ear, or bleeding from your nose, or both.  You notice a lump on your neck.  You are not getting better in 3-5 days.  You feel worse instead of better. GET HELP RIGHT AWAY IF:   You have pain that is not helped with medicine.  You have puffiness, redness, or pain around your ear.  You get a stiff neck.  You cannot move part of your face (paralysis).  You notice that the bone behind your ear hurts when you touch it. MAKE SURE YOU:   Understand these instructions.  Will watch your condition.  Will get help right away if you are not doing well or get worse.   This information is not intended to replace advice given to you by your health care provider. Make sure you discuss any questions you have with your health care provider.   Document Released: 12/13/2007 Document Revised: 07/17/2014 Document Reviewed: 01/21/2013 Elsevier Interactive Patient Education [employer]2016 [employer]

## 2016-01-02 NOTE — ED Provider Notes (Signed)
CSN: [id number]     Arrival date & time 01/02/16  1313 History   First MD Initiated Contact with Patient 01/02/16 1403     Chief Complaint  Patient presents with  . Otalgia    Patient is a [age] y.o. female presenting with ear pain. The history is provided by the patient.  Otalgia Location:  Right Behind ear:  No abnormality Quality:  Aching, pressure and sharp Severity:  Moderate Onset quality:  Gradual Duration:  1 week Timing:  Constant Progression:  Worsening Context: water   Relieved by:  Nothing Worsened by:  Nothing tried Associated symptoms: no congestion, no cough, no ear discharge, no fever, no hearing loss, no neck pain, no rhinorrhea, no sore throat and no tinnitus   Risk factors: no recent travel and no prior ear surgery   Pt started a week ago w/ bil ear pain but now the (R) ear pain has persisted and woprsened. 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 Has been using Cirpdex drops froma previous infection but has not helped. DEnies recent URI type symptoms. No fever.   History reviewed. No pertinent past medical history. Past Surgical History  Procedure Laterality Date  . Appendectomy    . Cesarean section     No family history on file. Social History  Substance Use Topics  . Smoking status: Never Smoker   . Smokeless tobacco: None  . Alcohol Use: Yes   OB History    No data available     Review of Systems  Constitutional: Negative for fever.  HENT: Positive for ear pain. Negative for congestion, ear discharge, hearing loss, rhinorrhea, sore throat and tinnitus.   Respiratory: Negative for cough.   Musculoskeletal: Negative for neck pain.  All other systems reviewed and are negative.   Allergies  Penicillins  Home Medications   Prior to Admission medications   Medication Sig Start Date End Date Taking? Authorizing Provider  Ciprofloxacin-Hydrocortisone (CIPRO HC OT) Place in ear(s).   Yes  Historical Provider, MD  ibuprofen (ADVIL,MOTRIN) 200 MG tablet Take 200 mg by mouth every 6 (six) hours as needed.   Yes Historical Provider, MD  azithromycin (ZITHROMAX Z-PAK) 250 MG tablet Take two tabs on day one then one table on days 2-5, 01/02/16   Leanne ChangKatherine P Julionna Marczak, NP   Meds Ordered and Administered this Visit  Medications - No data to display  BP 107/62 mmHg  Pulse 67  Temp(Src) 99 F (37.2 C) (Oral)  Resp 14  SpO2 100%  LMP 12/09/2015 No data found.   Physical Exam  Constitutional: She is oriented to person, place, and time. She appears well-developed and well-nourished.  HENT:  Head: Normocephalic and atraumatic.  Right Ear: Tympanic membrane is erythematous. A middle ear effusion is present.  Left Ear: Tympanic membrane normal.  Nose: Nose normal.  Mouth/Throat: Mucous membranes are normal.  Eyes: Conjunctivae are normal. Right eye exhibits no discharge. Left eye exhibits no discharge.  Neck: Neck supple.  Cardiovascular: Normal rate.   Pulmonary/Chest: Effort normal.  Neurological: She is alert and oriented to person, place, and time.  Skin: Skin is warm.  Nursing note and vitals reviewed.   ED Course  Procedures (including critical care time)  Labs Review Labs Reviewed - No data to display  Imaging Review No results found.   Visual Acuity Review  Right Eye Distance:   Left Eye Distance:   Bilateral Distance:    Right Eye  Near:   Left Eye Near:    Bilateral Near:         MDM   1. Otitis media with effusion, right   Otalgia (R) ear,  refractory to Ciprodex drops x several days.  Pen allergy > Z-Pack Encouraged tio continue OTC drops for swimmer's ear and  Benadryl po which may help reduce inner ear fluid. Home care and precautions discussed and  Provided in print. Pt verbalizes understanding.     Leanne ChangKatherine P Alania Overholt, NP 01/02/16 1511

## 2016-02-17 DIAGNOSIS — Z130 Encounter for screening for diseases of the blood and blood-forming organs and certain disorders involving the immune mechanism: Secondary | ICD-10-CM | POA: Diagnosis not present

## 2016-02-17 DIAGNOSIS — Z124 Encounter for screening for malignant neoplasm of cervix: Secondary | ICD-10-CM | POA: Diagnosis not present

## 2016-02-17 DIAGNOSIS — Z6824 Body mass index (BMI) 24.0-24.9, adult: Secondary | ICD-10-CM | POA: Diagnosis not present

## 2016-02-17 DIAGNOSIS — Z1231 Encounter for screening mammogram for malignant neoplasm of breast: Secondary | ICD-10-CM | POA: Diagnosis not present

## 2016-02-17 DIAGNOSIS — R87610 Atypical squamous cells of undetermined significance on cytologic smear of cervix (ASC-US): Secondary | ICD-10-CM | POA: Diagnosis not present

## 2016-02-17 DIAGNOSIS — Z1151 Encounter for screening for human papillomavirus (HPV): Secondary | ICD-10-CM | POA: Diagnosis not present

## 2016-02-17 DIAGNOSIS — Z1389 Encounter for screening for other disorder: Secondary | ICD-10-CM | POA: Diagnosis not present

## 2016-02-17 DIAGNOSIS — Z01419 Encounter for gynecological examination (general) (routine) without abnormal findings: Secondary | ICD-10-CM | POA: Diagnosis not present

## 2016-05-05 DIAGNOSIS — Z23 Encounter for immunization: Secondary | ICD-10-CM | POA: Diagnosis not present

## 2016-09-12 DIAGNOSIS — Z0000 Encounter for general adult medical examination without abnormal findings: Secondary | ICD-10-CM | POA: Diagnosis not present

## 2016-09-18 DIAGNOSIS — Z6824 Body mass index (BMI) 24.0-24.9, adult: Secondary | ICD-10-CM | POA: Diagnosis not present

## 2016-09-18 DIAGNOSIS — Z1389 Encounter for screening for other disorder: Secondary | ICD-10-CM | POA: Diagnosis not present

## 2016-09-18 DIAGNOSIS — L508 Other urticaria: Secondary | ICD-10-CM | POA: Diagnosis not present

## 2016-09-18 DIAGNOSIS — J3089 Other allergic rhinitis: Secondary | ICD-10-CM | POA: Diagnosis not present

## 2016-09-18 DIAGNOSIS — Z0000 Encounter for general adult medical examination without abnormal findings: Secondary | ICD-10-CM | POA: Diagnosis not present

## 2016-09-21 ENCOUNTER — Emergency Department (HOSPITAL_COMMUNITY)
Admission: EM | Admit: 2016-09-21 | Discharge: 2016-09-21 | Disposition: A | Discharge status: Home / Self Care | Payer: BLUE CROSS/BLUE SHIELD | Attending: Emergency Medicine | Admitting: Emergency Medicine

## 2016-09-21 ENCOUNTER — Encounter (HOSPITAL_COMMUNITY): Payer: Self-pay

## 2016-09-21 ENCOUNTER — Other Ambulatory Visit: Payer: Self-pay | Admitting: Cardiology

## 2016-09-21 ENCOUNTER — Emergency Department (HOSPITAL_COMMUNITY): Payer: BLUE CROSS/BLUE SHIELD

## 2016-09-21 DIAGNOSIS — R0789 Other chest pain: Secondary | ICD-10-CM | POA: Insufficient documentation

## 2016-09-21 DIAGNOSIS — R002 Palpitations: Secondary | ICD-10-CM | POA: Diagnosis present

## 2016-09-21 DIAGNOSIS — Z79899 Other long term (current) drug therapy: Secondary | ICD-10-CM | POA: Insufficient documentation

## 2016-09-21 DIAGNOSIS — R079 Chest pain, unspecified: Secondary | ICD-10-CM

## 2016-09-21 LAB — D-DIMER, QUANTITATIVE (NOT AT ARMC): D DIMER QUANT: 0.31 ug{FEU}/mL (ref 0.00–0.50)

## 2016-09-21 LAB — I-STAT TROPONIN, ED
Troponin i, poc: 0 ng/mL (ref 0.00–0.08)
Troponin i, poc: 0 ng/mL (ref 0.00–0.08)

## 2016-09-21 LAB — CBC
HEMATOCRIT: 39.2 % (ref 36.0–46.0)
HEMOGLOBIN: 13.1 g/dL (ref 12.0–15.0)
MCH: 30.6 pg (ref 26.0–34.0)
MCHC: 33.4 g/dL (ref 30.0–36.0)
MCV: 91.6 fL (ref 78.0–100.0)
Platelets: 288 10*3/uL (ref 150–400)
RBC: 4.28 MIL/uL (ref 3.87–5.11)
RDW: 12 % (ref 11.5–15.5)
WBC: 6.5 10*3/uL (ref 4.0–10.5)

## 2016-09-21 LAB — BASIC METABOLIC PANEL
ANION GAP: 10 (ref 5–15)
BUN: 11 mg/dL (ref 6–20)
CO2: 22 mmol/L (ref 22–32)
Calcium: 9.2 mg/dL (ref 8.9–10.3)
Chloride: 103 mmol/L (ref 101–111)
Creatinine, Ser: 0.89 mg/dL (ref 0.44–1.00)
Glucose, Bld: 93 mg/dL (ref 65–99)
POTASSIUM: 3.2 mmol/L — AB (ref 3.5–5.1)
SODIUM: 135 mmol/L (ref 135–145)

## 2016-09-21 MED ORDER — SODIUM CHLORIDE 0.9 % IV BOLUS (SEPSIS)
1000.0000 mL | Freq: Once | INTRAVENOUS | Status: AC
  Administered 2016-09-21: 1000 mL via INTRAVENOUS

## 2016-09-21 MED ORDER — POTASSIUM CHLORIDE CRYS ER 20 MEQ PO TBCR
40.0000 meq | EXTENDED_RELEASE_TABLET | Freq: Once | ORAL | Status: AC
  Administered 2016-09-21: 40 meq via ORAL
  Filled 2016-09-21: qty 2

## 2016-09-21 NOTE — ED Provider Notes (Signed)
[facility]-EMERGENCY DEPT Provider Note   CSN: [id number] Arrival date & time: 09/21/16  1115  By signing my name below, I, Majel Homer, attest that this documentation has been prepared under the direction and in the presence of [facility], PA-C . Electronically Signed: Majel Homer, Scribe. 09/21/2016. 12:16 PM.  History   Chief Complaint Chief Complaint  Patient presents with  . Chest Pain   The history is provided by the patient. No language interpreter was used.   HPI Comments: Michaela Castillo is a 41 y.o. female who presents to the Emergency Department complaining of sudden onset, gradually improving, left-sided chest pain described as chest tightness that began at ~10:00 AM this morning. Pt reports her pain radiates into her left shoulder and upper back with associated intermittent nausea, diaphoresis, weakness, lightheadedness, left hand "numbness" and sensation of palpitations that has now resolved. She states she has "passed out" twice before in which she experienced similar bilateral hand numbness but no other associated symptoms. She notes her pain is temporarily relieved when taking a deep breath. Pt reports she recently travelled to [place] via plane ~2 weeks ago. She denies any shortness of breath, vomiting, use of hormone replacement therapy, recent surgeries, calf tenderness, leg swelling or hx of PE or DVT.   History reviewed. No pertinent past medical history.  Patient Active Problem List   Diagnosis Date Noted  . Chest pain 09/21/2016  . Palpitations 09/21/2016   Past Surgical History:  Procedure Laterality Date  . APPENDECTOMY    . CESAREAN SECTION      OB History    No data available     Home Medications    Prior to Admission medications   Medication Sig Start Date End Date Taking? Authorizing Provider  azithromycin (ZITHROMAX Z-PAK) 250 MG tablet Take two tabs on day one then one table on days 2-5, 01/02/16   Leanne Chang, NP    Ciprofloxacin-Hydrocortisone (CIPRO HC OT) Place in ear(s).    Historical Provider, MD  ibuprofen (ADVIL,MOTRIN) 200 MG tablet Take 200 mg by mouth every 6 (six) hours as needed.    Historical Provider, MD    Family History No family history on file.  Social History Social History  Substance Use Topics  . Smoking status: Never Smoker  . Smokeless tobacco: Not on file  . Alcohol use Yes   Allergies   Naproxen and Penicillins  Review of Systems Review of Systems  Constitutional: Positive for diaphoresis.  Respiratory: Positive for chest tightness. Negative for shortness of breath.   Cardiovascular: Positive for chest pain and palpitations.  Gastrointestinal: Positive for nausea. Negative for vomiting.  Genitourinary: Negative for difficulty urinating.  Neurological: Positive for weakness, light-headedness and numbness.  All other systems reviewed and are negative.  Physical Exam Updated Vital Signs BP 110/88 (BP Location: Left Arm)   Pulse 106   Temp 98 F (36.7 C) (Oral)   LMP 09/07/2016   SpO2 100%   Physical Exam  Constitutional: She is oriented to person, place, and time. She appears well-developed and well-nourished. No distress.  HENT:  Head: Normocephalic and atraumatic.  Eyes: EOM are normal.  Neck: Normal range of motion. No JVD present.  No JVD  Cardiovascular: Normal rate, regular rhythm and normal heart sounds.   Regular rate and rhythm. Equal and intact radial pulses.   Pulmonary/Chest: Effort normal and breath sounds normal.  Abdominal: Soft. She exhibits no distension. There is no tenderness.  Musculoskeletal: Normal range of motion.  No lower  extremity swelling or calf tenderness.   Neurological: She is alert and oriented to person, place, and time.  All four extremities neurovascularly intact.   Skin: Skin is warm and dry.  Psychiatric: She has a normal mood and affect. Judgment normal.  Nursing note and vitals reviewed.  ED Treatments / Results   DIAGNOSTIC STUDIES:  Oxygen Saturation is 100% on RA, normal by my interpretation.   COORDINATION OF CARE:  12:12 PM Discussed treatment plan with pt at bedside and pt agreed to plan   Labs (all labs ordered are listed, but only abnormal results are displayed) Labs Reviewed  BASIC METABOLIC PANEL - Abnormal; Notable for the following:       Result Value   Potassium 3.2 (*)    All other components within normal limits  CBC  D-DIMER, QUANTITATIVE (NOT AT [facility])  I-STAT TROPOININ, ED  Rosezena SensorI-STAT TROPOININ, ED    EKG  EKG Interpretation None       Radiology Dg Chest 2 View  Result Date: 09/21/2016 CLINICAL DATA:  Left-sided chest pain radiating to the shoulder. EXAM: CHEST  2 VIEW COMPARISON:  None. FINDINGS: Normal heart size and mediastinal contours. No acute infiltrate or edema. No effusion or pneumothorax. No acute osseous findings. IMPRESSION: Negative chest. Electronically Signed   By: Marnee SpringJonathon  Watts M.D.   On: 09/21/2016 11:58    Procedures Procedures (including critical care time)  Medications Ordered in ED Medications  sodium chloride 0.9 % bolus 1,000 mL (0 mLs Intravenous Stopped 09/21/16 1401)  potassium chloride SA (K-DUR,KLOR-CON) CR tablet 40 mEq (40 mEq Oral Given 09/21/16 1334)    Initial Impression / Assessment and Plan / ED Course  I have reviewed the triage vital signs and the nursing notes.  Pertinent labs & imaging results that were available during my care of the patient were reviewed by me and considered in my medical decision making (see chart for details).     Michaela Castillo is a [age] y.o. female who presents to ED for chest pain associated with nausea, diaphoresis, shoulder pain and left arm numbness. EKG initially tachycardic with HR in the 120's. Upon evaluation, heart rate regular. Repeat EKG non-ischemic with HR in the 70's. Given tachycardia and chest pain, d-dimer was obtained which was negative. Labs reviewed with a Potassium of 3.2 -  replenished in the ED today. Troponin negative. Given concerning story, cardiology consulted who came to evaluate patient. Please see cards consultation note for full details - appreciate their assistance. Patient to receive outpatient cardiac work up per cards. 2nd troponin negative. Patient understands cards follow up plan. Contact info for clinic given. Reasons to return to ER discussed with patient and husband at bedside. All questions answered.     I personally performed the services described in this documentation, which was scribed in my presence. The recorded information has been reviewed and is accurate.   Final Clinical Impressions(s) / ED Diagnoses   Final diagnoses:  Chest pain, unspecified type    New Prescriptions New Prescriptions   No medications on file     [facility] Hospital[facility]ime Pilcher Ward, PA-C 09/21/16 1607    Canary Brimhristopher J Tegeler, MD 09/21/16 2147

## 2016-09-21 NOTE — H&P (Signed)
Patient ID: Michaela Castillo MRN: [id number], DOB/AGE: 43/11/1975   Admit date: 09/21/2016   Primary Physician: Martha ClanShaw, William, MD Primary Cardiologist: New-PN  HPI: 41 y/o female with no history of chest pain or palpitations, [facility] after she developed Lt sided chest discomfort and palpitations while on a field trip with her child's class. The pt says [facility] as a chaperone for a school felid trip when she noted Lt sided chest discomfort associated with palpitations. She had some Lt arm numbness and became nauseated-"I had to sit down". She denies any diaphoresis. She does take regular Claritin and has resumed this past Monday. She denies any unusual stress, fever, or prior tachycardia. She is in NSR and her initial Troponin is negative.    Problem List: History reviewed. No pertinent past medical history.  Past Surgical History:  Procedure Laterality Date  . APPENDECTOMY    . CESAREAN SECTION       Allergies:  Allergies  Allergen Reactions  . Naproxen Hives    hives  . Penicillins      Home Medications Claritin  History reviewed. No pertinent past medical history.  Prior to Admission medications   Medication Sig Start Date End Date Taking? Authorizing Provider  azithromycin (ZITHROMAX Z-PAK) 250 MG tablet Take two tabs on day one then one table on days 2-5, 01/02/16   Leanne ChangKatherine P Schorr, NP  Ciprofloxacin-Hydrocortisone (CIPRO HC OT) Place in ear(s).    Historical Provider, MD  ibuprofen (ADVIL,MOTRIN) 200 MG tablet Take 200 mg by mouth every 6 (six) hours as needed.    Historical Provider, MD     FMhX- no family history of CAD or arrhythmia.   Social History   Social History  . Marital status: Married    Spouse name: N/A  . Number of children: N/A  . Years of education: N/A   Occupational History  . Not on file.   Social History Main Topics  . Smoking status: Never Smoker  . Smokeless tobacco: Not on  file  . Alcohol use Yes  . Drug use: No  . Sexual activity: Not on file   Other Topics Concern  . Not on file   Social History Narrative  . No narrative on file     Review of Systems: General: negative for chills, fever, night sweats or weight changes.  Cardiovascular: negative for dyspnea on exertion, edema, orthopnea, paroxysmal nocturnal dyspnea or shortness of breath HEENT: negative for any visual disturbances, blindness, glaucoma Dermatological: negative for rash Respiratory: negative for cough, hemoptysis, or wheezing Urologic: negative for hematuria or dysuria Abdominal: negative for nausea, vomiting, diarrhea, bright red blood per rectum, melena, or hematemesis Neurologic: negative for visual changes, syncope, or dizziness Musculoskeletal: negative for back pain, joint pain, or swelling Psych: cooperative and appropriate All other systems reviewed and are otherwise negative except as noted above.  Physical Exam: Blood pressure 108/79, pulse 97, temperature 98 F (36.7 C), temperature source Oral, resp. rate 16, last menstrual period 09/07/2016, SpO2 99 %.  General appearance: alert, cooperative, no distress and anxious Neck: no carotid bruit and no JVD Lungs: clear to auscultation bilaterally Heart: regular rate and rhythm Abdomen: soft, non-tender; bowel sounds normal; no masses,  no organomegaly Extremities: extremities normal, atraumatic, no cyanosis or edema Pulses: 2+ and symmetric Skin: Skin color, texture, turgor normal. No rashes or lesions Neurologic: Grossly normal    Labs:   Results for orders placed or  performed during the hospital encounter of 09/21/16 (from the past 24 hour(s))  Basic metabolic panel     Status: Abnormal   Collection Time: 09/21/16 11:29 AM  Result Value Ref Range   Sodium 135 135 - 145 mmol/L   Potassium 3.2 (L) 3.5 - 5.1 mmol/L   Chloride 103 101 - 111 mmol/L   CO2 22 22 - 32 mmol/L   Glucose, Bld 93 65 - 99 mg/dL   BUN 11 6  - 20 mg/dL   Creatinine, Ser 1.61 0.44 - 1.00 mg/dL   Calcium 9.2 8.9 - 09.6 mg/dL   GFR calc non Af Amer >60 >60 mL/min   GFR calc Af Amer >60 >60 mL/min   Anion gap 10 5 - 15  CBC     Status: None   Collection Time: 09/21/16 11:29 AM  Result Value Ref Range   WBC 6.5 4.0 - 10.5 K/uL   RBC 4.28 3.87 - 5.11 MIL/uL   Hemoglobin 13.1 12.0 - 15.0 g/dL   HCT 04.5 40.9 - 81.1 %   MCV 91.6 78.0 - 100.0 fL   MCH 30.6 26.0 - 34.0 pg   MCHC 33.4 30.0 - 36.0 g/dL   RDW 91.4 78.2 - 95.6 %   Platelets 288 150 - 400 K/uL  D-dimer, quantitative (not at [facility])     Status: None   Collection Time: 09/21/16 11:29 AM  Result Value Ref Range   D-Dimer, Quant 0.31 0.00 - 0.50 ug/mL-FEU  I-stat troponin, ED     Status: None   Collection Time: 09/21/16 11:38 AM  Result Value Ref Range   Troponin i, poc 0.00 0.00 - 0.08 ng/mL   Comment 3             Radiology/Studies: Dg Chest 2 View  Result Date: 09/21/2016 CLINICAL DATA:  Left-sided chest pain radiating to the shoulder. EXAM: CHEST  2 VIEW COMPARISON:  None. FINDINGS: Normal heart size and mediastinal contours. No acute infiltrate or edema. No effusion or pneumothorax. No acute osseous findings. IMPRESSION: Negative chest. Electronically Signed   By: Marnee Spring M.D.   On: 09/21/2016 11:58    EKG: NSR  ASSESSMENT AND PLAN:  Principal Problem:   Chest pain Active Problems:   Palpitations   PLAN: MD to see. Consider OP monitor, treadmill, and echo.  I would avoid decongestants.    Jolene Provost, PA-C 09/21/2016, 1:57 PM [phone]  Patient examined chart reviewed. Generally healthy white female. Came to [facility] with [facility]. Has had before but no precipitating event this time. Exam remarkable for SEM LLSB radiating to left side of neck Clear lungs Telemetry with NSR no arrhythmias. ECG is normal including QT. Labs ok She feels better now. Would ambulate make sure she can take PO.  Offered her cardiac  CTA now or outpatient evaluation with ETT and echo and we both agreed to latter. Discussed with ER physician and PA to make arrangements   Charlton Haws, MD

## 2016-09-21 NOTE — ED Triage Notes (Signed)
Pt reports left sided chest pain that radiates to left shoulder and back. This started while she was [place] the science center this morning with her children around 10:50am. She reports she felt weak, lightheaded and nauseous but her hands still feel tingly.

## 2016-09-21 NOTE — Discharge Instructions (Signed)
° °  Chest Wall Pain Chest wall pain is pain in or around the bones and muscles of your chest. Sometimes, an injury causes this pain. Sometimes, the cause may not be known. This pain may take several weeks or longer to get better. Follow these instructions at home: Pay attention to any changes in your symptoms. Take these actions to help with your pain:  Rest as told by your health care provider.  Avoid activities that cause pain. These include any activities that use your chest muscles or your abdominal and side muscles to lift heavy items.  If directed, apply ice to the painful area:  Put ice in a plastic bag.  Place a towel between your skin and the bag.  Leave the ice on for 20 minutes, 2-3 times per day.  Take over-the-counter and prescription medicines only as told by your health care provider.  Do not use tobacco products, including cigarettes, chewing tobacco, and e-cigarettes. If you need help quitting, ask your health care provider.  Keep all follow-up visits as told by your health care provider. This is important. Contact a health care provider if:  You have a fever.  Your chest pain becomes worse.  You have new symptoms. Get help right away if:  You have nausea or vomiting.  You feel sweaty or light-headed.  You have a cough with phlegm (sputum) or you cough up blood.  You develop shortness of breath. This information is not intended to replace advice given to you by your health care provider. Make sure you discuss any questions you have with your health care provider. Document Released: 06/26/2005 Document Revised: 11/04/2015 Document Reviewed: 09/21/2014 Elsevier Interactive Patient Education  2017 [employer] You should get a call to schedule your follow up with the cardiologist. If you do not get a call by tomorrow afternoon, call the clinic (listed) to inquire about your appointment.  Return to ER for return of symptoms, any additional concerns.

## 2016-09-21 NOTE — ED Notes (Signed)
Taken to xray at this time. 

## 2016-10-10 ENCOUNTER — Encounter: Payer: Self-pay | Admitting: Cardiovascular Disease

## 2016-10-12 ENCOUNTER — Ambulatory Visit (HOSPITAL_COMMUNITY): Payer: BLUE CROSS/BLUE SHIELD | Attending: Cardiovascular Disease

## 2016-10-12 ENCOUNTER — Other Ambulatory Visit: Payer: Self-pay

## 2016-10-12 DIAGNOSIS — I351 Nonrheumatic aortic (valve) insufficiency: Secondary | ICD-10-CM | POA: Insufficient documentation

## 2016-10-12 DIAGNOSIS — R0789 Other chest pain: Secondary | ICD-10-CM

## 2016-10-12 LAB — ECHOCARDIOGRAM COMPLETE
E decel time: 313 msec
E/e' ratio: 5.79
FS: 38 % (ref 28–44)
IVS/LV PW RATIO, ED: 0.82
LA ID, A-P, ES: 31 mm
LA diam end sys: 31 mm
LA diam index: 1.93 cm/m2
LA vol A4C: 23 ml
LA vol index: 21.1 mL/m2
LA vol: 34 mL
LV E/e' medial: 5.79
LV E/e'average: 5.79
LV PW d: 8.33 mm — AB (ref 0.6–1.1)
LV e' LATERAL: 11.5 cm/s
LVOT SV: 72 mL
LVOT VTI: 22.9 cm
LVOT area: 3.14 cm2
LVOT diameter: 20 mm
LVOT peak vel: 108 cm/s
Lateral S' vel: 12.7 cm/s
MV Dec: 313
MV pk A vel: 64.7 m/s
MV pk E vel: 66.6 m/s
P 1/2 time: 630 ms
Peak grad: 205 mmHg
RV sys press: 20 mmHg
Reg peak vel: 205 cm/s
TDI e' lateral: 11.5
TDI e' medial: 10.1
TR max vel: 205 cm/s

## 2016-10-17 ENCOUNTER — Ambulatory Visit (INDEPENDENT_AMBULATORY_CARE_PROVIDER_SITE_OTHER): Payer: BLUE CROSS/BLUE SHIELD

## 2016-10-17 DIAGNOSIS — R079 Chest pain, unspecified: Secondary | ICD-10-CM

## 2016-10-17 LAB — EXERCISE TOLERANCE TEST
Estimated workload: 13.4 METS
Exercise duration (min): 12 min
Exercise duration (sec): 0 s
MPHR: 178 {beats}/min
Peak HR: 164 {beats}/min
Percent HR: 92 %
RPE: 15
Rest HR: 71 {beats}/min

## 2016-10-19 ENCOUNTER — Telehealth: Payer: Self-pay | Admitting: *Deleted

## 2016-10-19 NOTE — Telephone Encounter (Signed)
Left msg to call.

## 2016-10-19 NOTE — Telephone Encounter (Signed)
-----   Message from Abelino Derrick, [place] sent at 10/18/2016 12:47 PM EDT ----- Please let pt know her stress test was OK- keep follow up with Dr Eden Emms as scheduled  Corine Shelter PA-C 10/18/2016 12:47 PM

## 2016-10-20 NOTE — Progress Notes (Signed)
Cardiology Office Note   Date:  10/24/2016   ID:  Rayley, Gao March 10, 1976, MRN [id number]  PCP:  Martha Clan, MD  Cardiologist:   Charlton Haws, MD   Chief Complaint  Patient presents with  . Palpitations  . Establish Care      History of Present Illness: Michaela Castillo is a 41 y.o. female who presents for post hospital f/u chest pain Seen at request of Corine Shelter PA and Dr Clelia Croft  Seen by me in ER 09/21/16 atypical chest pain and palpitations. Some vagal sounding components  R/O no acute ECG changes Was d/c home for outpatient stress testing.Noted to have soft SEM on exam.     10/17/16 ETT:  Personally reviewed normal no ischemia Max HR 164 92%  10/17/16 Echo EF 55-60% trivial AR   Very infrequent sharp SSCP lasting seconds Some dizziness doing yoga with rapid change in positoin  History reviewed. No pertinent past medical history. benign   Past Surgical History:  Procedure Laterality Date  . APPENDECTOMY    . CESAREAN SECTION       No current outpatient prescriptions on file.   No current facility-administered medications for this visit.     Allergies:   Naproxen and Penicillins    Social History:  The patient  reports that she has never smoked. She has never used smokeless tobacco. She reports that she drinks alcohol. She reports that she does not use drugs.   Family History:  No premature CAD mother and father still living    ROS:  Please see the history of present illness.   Otherwise, review of systems are positive for none.   All other systems are reviewed and negative.    PHYSICAL EXAM: VS:  BP 118/62   Pulse 70   Ht 5' 4.5" (1.638 m)   Wt 144 lb 6.4 oz (65.5 kg)   SpO2 98%   BMI 24.40 kg/m  , BMI Body mass index is 24.4 kg/m. Affect appropriate Healthy:  appears stated age HEENT: normal Neck supple with no adenopathy JVP normal no bruits no thyromegaly Lungs clear with no wheezing and good diaphragmatic motion Heart:  S1/S2  1/6 SEM  murmur, no rub, gallop or click PMI normal Abdomen: benighn, BS positve, no tenderness, no AAA no bruit.  No HSM or HJR Distal pulses intact with no bruits No edema Neuro non-focal Skin warm and dry No muscular weakness    EKG:   09/22/16 ST rate 126 RAD nonspecific ST chagnes    Recent Labs: 09/21/2016: BUN 11; Creatinine, Ser 0.89; Hemoglobin 13.1; Platelets 288; Potassium 3.2; Sodium 135    Lipid Panel    Component Value Date/Time   CHOL 147 03/02/2010 1015   TRIG 54.0 03/02/2010 1015   HDL 59.90 03/02/2010 1015   CHOLHDL 2 03/02/2010 1015   VLDL 10.8 03/02/2010 1015   LDLCALC 76 03/02/2010 1015      Wt Readings from Last 3 Encounters:  10/24/16 144 lb 6.4 oz (65.5 kg)  03/02/10 127 lb (57.6 kg)      Other studies Reviewed: Additional studies/ records that were reviewed today include: Notes ER 09/2016 CXR, ECG f/u ETT and Echo .    ASSESSMENT AND PLAN:  1. Chest pain atypical normal ETT observe 2. Murmur: benign no significant valve dx on echo no need for SBE 3. Palpitations resolved given normal ETT and echo no need for monitor  4. Dizzy:  Sounds like benign positional changes not true  vertigo she will call If worsens and she can have trial of antivert or referral to vestibular PT   Current medicines are reviewed at length with the patient today.  The patient does not have concerns regarding medicines.  The following changes have been made:  no change  Labs/ tests ordered today include: none  No orders of the defined types were placed in this encounter.    Disposition:   FU with [place] PRN      Signed, Charlton Haws, MD  10/24/2016 8:18 AM    [facility] [address] [place], [place], [place]  [address] Phone: [phone]; Fax: [phone]

## 2016-10-24 ENCOUNTER — Encounter: Payer: Self-pay | Admitting: Cardiovascular Disease

## 2016-10-24 ENCOUNTER — Ambulatory Visit (INDEPENDENT_AMBULATORY_CARE_PROVIDER_SITE_OTHER): Payer: BLUE CROSS/BLUE SHIELD | Admitting: Cardiovascular Disease

## 2016-10-24 ENCOUNTER — Encounter (INDEPENDENT_AMBULATORY_CARE_PROVIDER_SITE_OTHER): Payer: Self-pay

## 2016-10-24 VITALS — BP 118/62 | HR 70 | Ht 64.5 in | Wt 144.4 lb

## 2016-10-24 DIAGNOSIS — Z7689 Persons encountering health services in other specified circumstances: Secondary | ICD-10-CM | POA: Diagnosis not present

## 2016-10-24 DIAGNOSIS — R002 Palpitations: Secondary | ICD-10-CM

## 2016-10-24 NOTE — Patient Instructions (Addendum)
Medication Instructions:  Your physician recommends that you continue on your current medications as directed. Please refer to the Current Medication list given to you today.  Labwork: NONE  Testing/Procedures: NONE  Follow-Up: Your physician wants you to follow-up as needed with  Dr. Nishan.    If you need a refill on your cardiac medications before your next appointment, please call your pharmacy.    

## 2017-05-09 DIAGNOSIS — Z23 Encounter for immunization: Secondary | ICD-10-CM | POA: Diagnosis not present

## 2017-06-19 DIAGNOSIS — Z1389 Encounter for screening for other disorder: Secondary | ICD-10-CM | POA: Diagnosis not present

## 2017-06-19 DIAGNOSIS — Z130 Encounter for screening for diseases of the blood and blood-forming organs and certain disorders involving the immune mechanism: Secondary | ICD-10-CM | POA: Diagnosis not present

## 2017-06-19 DIAGNOSIS — Z6824 Body mass index (BMI) 24.0-24.9, adult: Secondary | ICD-10-CM | POA: Diagnosis not present

## 2017-06-19 DIAGNOSIS — Z1231 Encounter for screening mammogram for malignant neoplasm of breast: Secondary | ICD-10-CM | POA: Diagnosis not present

## 2017-06-19 DIAGNOSIS — Z01419 Encounter for gynecological examination (general) (routine) without abnormal findings: Secondary | ICD-10-CM | POA: Diagnosis not present

## 2017-09-12 DIAGNOSIS — Z0000 Encounter for general adult medical examination without abnormal findings: Secondary | ICD-10-CM | POA: Diagnosis not present

## 2017-09-19 DIAGNOSIS — R002 Palpitations: Secondary | ICD-10-CM | POA: Diagnosis not present

## 2017-09-19 DIAGNOSIS — L509 Urticaria, unspecified: Secondary | ICD-10-CM | POA: Diagnosis not present

## 2017-09-19 DIAGNOSIS — J309 Allergic rhinitis, unspecified: Secondary | ICD-10-CM | POA: Diagnosis not present

## 2017-09-19 DIAGNOSIS — Z0000 Encounter for general adult medical examination without abnormal findings: Secondary | ICD-10-CM | POA: Diagnosis not present

## 2017-09-19 DIAGNOSIS — Z1389 Encounter for screening for other disorder: Secondary | ICD-10-CM | POA: Diagnosis not present

## 2018-02-07 DIAGNOSIS — Z23 Encounter for immunization: Secondary | ICD-10-CM | POA: Diagnosis not present

## 2018-04-18 DIAGNOSIS — Z23 Encounter for immunization: Secondary | ICD-10-CM | POA: Diagnosis not present

## 2018-05-12 DIAGNOSIS — Z23 Encounter for immunization: Secondary | ICD-10-CM | POA: Diagnosis not present

## 2018-08-08 DIAGNOSIS — J111 Influenza due to unidentified influenza virus with other respiratory manifestations: Secondary | ICD-10-CM | POA: Diagnosis not present

## 2018-08-08 DIAGNOSIS — R509 Fever, unspecified: Secondary | ICD-10-CM | POA: Diagnosis not present

## 2018-08-08 DIAGNOSIS — Z6824 Body mass index (BMI) 24.0-24.9, adult: Secondary | ICD-10-CM | POA: Diagnosis not present

## 2018-08-17 DIAGNOSIS — J069 Acute upper respiratory infection, unspecified: Secondary | ICD-10-CM | POA: Diagnosis not present

## 2018-09-09 DIAGNOSIS — Z1231 Encounter for screening mammogram for malignant neoplasm of breast: Secondary | ICD-10-CM | POA: Diagnosis not present

## 2018-09-09 DIAGNOSIS — Z1389 Encounter for screening for other disorder: Secondary | ICD-10-CM | POA: Diagnosis not present

## 2018-09-09 DIAGNOSIS — Z01419 Encounter for gynecological examination (general) (routine) without abnormal findings: Secondary | ICD-10-CM | POA: Diagnosis not present

## 2018-09-09 DIAGNOSIS — Z6824 Body mass index (BMI) 24.0-24.9, adult: Secondary | ICD-10-CM | POA: Diagnosis not present

## 2018-09-09 DIAGNOSIS — Z130 Encounter for screening for diseases of the blood and blood-forming organs and certain disorders involving the immune mechanism: Secondary | ICD-10-CM | POA: Diagnosis not present

## 2018-09-18 DIAGNOSIS — R7989 Other specified abnormal findings of blood chemistry: Secondary | ICD-10-CM | POA: Diagnosis not present

## 2018-09-18 DIAGNOSIS — Z0000 Encounter for general adult medical examination without abnormal findings: Secondary | ICD-10-CM | POA: Diagnosis not present

## 2019-06-03 DIAGNOSIS — Z20828 Contact with and (suspected) exposure to other viral communicable diseases: Secondary | ICD-10-CM | POA: Diagnosis not present

## 2019-08-25 ENCOUNTER — Ambulatory Visit: Payer: BLUE CROSS/BLUE SHIELD | Attending: Internal Medicine

## 2019-08-25 DIAGNOSIS — Z20822 Contact with and (suspected) exposure to covid-19: Secondary | ICD-10-CM | POA: Diagnosis not present

## 2019-08-26 LAB — NOVEL CORONAVIRUS, NAA: SARS-CoV-2, NAA: NOT DETECTED

## 2019-10-01 DIAGNOSIS — Z1151 Encounter for screening for human papillomavirus (HPV): Secondary | ICD-10-CM | POA: Diagnosis not present

## 2019-10-01 DIAGNOSIS — Z130 Encounter for screening for diseases of the blood and blood-forming organs and certain disorders involving the immune mechanism: Secondary | ICD-10-CM | POA: Diagnosis not present

## 2019-10-01 DIAGNOSIS — Z124 Encounter for screening for malignant neoplasm of cervix: Secondary | ICD-10-CM | POA: Diagnosis not present

## 2019-10-01 DIAGNOSIS — Z01419 Encounter for gynecological examination (general) (routine) without abnormal findings: Secondary | ICD-10-CM | POA: Diagnosis not present

## 2019-11-17 DIAGNOSIS — Z1231 Encounter for screening mammogram for malignant neoplasm of breast: Secondary | ICD-10-CM | POA: Diagnosis not present

## 2020-03-02 ENCOUNTER — Other Ambulatory Visit: Payer: BC Managed Care – PPO

## 2020-03-02 ENCOUNTER — Other Ambulatory Visit: Payer: Self-pay

## 2020-03-02 ENCOUNTER — Other Ambulatory Visit: Payer: Self-pay | Admitting: *Deleted

## 2020-03-02 DIAGNOSIS — Z20822 Contact with and (suspected) exposure to covid-19: Secondary | ICD-10-CM | POA: Diagnosis not present

## 2020-03-03 LAB — NOVEL CORONAVIRUS, NAA: SARS-CoV-2, NAA: NOT DETECTED

## 2020-03-03 LAB — SARS-COV-2, NAA 2 DAY TAT

## 2020-03-19 ENCOUNTER — Other Ambulatory Visit: Payer: Self-pay

## 2020-03-19 ENCOUNTER — Other Ambulatory Visit: Payer: BLUE CROSS/BLUE SHIELD

## 2020-03-19 DIAGNOSIS — Z20822 Contact with and (suspected) exposure to covid-19: Secondary | ICD-10-CM | POA: Diagnosis not present

## 2020-03-22 LAB — NOVEL CORONAVIRUS, NAA: SARS-CoV-2, NAA: NOT DETECTED

## 2020-07-21 DIAGNOSIS — Z20822 Contact with and (suspected) exposure to covid-19: Secondary | ICD-10-CM | POA: Diagnosis not present

## 2020-08-13 DIAGNOSIS — H6592 Unspecified nonsuppurative otitis media, left ear: Secondary | ICD-10-CM | POA: Diagnosis not present

## 2021-04-06 DIAGNOSIS — Z6825 Body mass index (BMI) 25.0-25.9, adult: Secondary | ICD-10-CM | POA: Diagnosis not present

## 2021-04-06 DIAGNOSIS — Z130 Encounter for screening for diseases of the blood and blood-forming organs and certain disorders involving the immune mechanism: Secondary | ICD-10-CM | POA: Diagnosis not present

## 2021-04-06 DIAGNOSIS — Z1389 Encounter for screening for other disorder: Secondary | ICD-10-CM | POA: Diagnosis not present

## 2021-04-06 DIAGNOSIS — Z1231 Encounter for screening mammogram for malignant neoplasm of breast: Secondary | ICD-10-CM | POA: Diagnosis not present

## 2021-04-06 DIAGNOSIS — Z01419 Encounter for gynecological examination (general) (routine) without abnormal findings: Secondary | ICD-10-CM | POA: Diagnosis not present

## 2022-03-17 DIAGNOSIS — R7989 Other specified abnormal findings of blood chemistry: Secondary | ICD-10-CM | POA: Diagnosis not present

## 2022-03-24 DIAGNOSIS — Z1339 Encounter for screening examination for other mental health and behavioral disorders: Secondary | ICD-10-CM | POA: Diagnosis not present

## 2022-03-24 DIAGNOSIS — Z1331 Encounter for screening for depression: Secondary | ICD-10-CM | POA: Diagnosis not present

## 2022-03-24 DIAGNOSIS — Z23 Encounter for immunization: Secondary | ICD-10-CM | POA: Diagnosis not present

## 2022-03-24 DIAGNOSIS — Z13220 Encounter for screening for lipoid disorders: Secondary | ICD-10-CM | POA: Diagnosis not present

## 2022-03-24 DIAGNOSIS — Z0000 Encounter for general adult medical examination without abnormal findings: Secondary | ICD-10-CM | POA: Diagnosis not present

## 2022-05-16 ENCOUNTER — Encounter: Payer: Self-pay | Admitting: Gastroenterology

## 2022-05-31 ENCOUNTER — Ambulatory Visit (AMBULATORY_SURGERY_CENTER): Payer: Self-pay | Admitting: *Deleted

## 2022-05-31 VITALS — Ht 64.5 in | Wt 142.0 lb

## 2022-05-31 DIAGNOSIS — Z1211 Encounter for screening for malignant neoplasm of colon: Secondary | ICD-10-CM

## 2022-05-31 MED ORDER — NA SULFATE-K SULFATE-MG SULF 17.5-3.13-1.6 GM/177ML PO SOLN: 1.0000 | Freq: Once | ORAL | 0 refills | Status: AC

## 2022-05-31 NOTE — Progress Notes (Signed)
No egg or soy allergy known to patient  No issues known to pt with past sedation with any surgeries or procedures Patient denies ever being told they had issues or difficulty with intubation  No FH of Malignant Hyperthermia Pt is not on diet pills Pt is not on  home 02  Pt is not on blood thinners  Pt denies issues with constipation  Pt encouraged to use to use Singlecare or Goodrx to reduce cost  Patient's chart reviewed by John Nulty CNRA prior to previsit and patient appropriate for the [facility].  Previsit completed and red dot placed by patient's name on their procedure day (on provider's schedule).     

## 2022-06-05 ENCOUNTER — Encounter: Payer: Self-pay | Admitting: Certified Registered Nurse Anesthetist

## 2022-06-07 ENCOUNTER — Encounter: Payer: Self-pay | Admitting: Gastroenterology

## 2022-06-12 ENCOUNTER — Encounter: Payer: Self-pay | Admitting: Gastroenterology

## 2022-06-12 ENCOUNTER — Ambulatory Visit (AMBULATORY_SURGERY_CENTER): Payer: BC Managed Care – PPO | Admitting: Gastroenterology

## 2022-06-12 VITALS — BP 121/69 | HR 63 | Temp 99.6°F | Resp 13 | Ht 64.5 in | Wt 142.0 lb

## 2022-06-12 DIAGNOSIS — Z1211 Encounter for screening for malignant neoplasm of colon: Secondary | ICD-10-CM

## 2022-06-12 MED ORDER — SODIUM CHLORIDE 0.9 % IV SOLN: 500.0000 mL | INTRAVENOUS | Status: DC

## 2022-06-12 NOTE — Progress Notes (Signed)
   Referring Provider: Cleatis Polka., MD Primary Care Physician:  Cleatis Polka., MD  Indication for Colonoscopy:  Colon cancer screening   IMPRESSION:  Need for colon cancer screening Appropriate candidate for monitored anesthesia care  PLAN: Colonoscopy in the [facility] today   HPI: Michaela Castillo is a 46 y.o. female presents for screening colonoscopy.  No prior colonoscopy or colon cancer screening.  Paternal grandfather with colon cancer. No other known family history of colon cancer or polyps. No family history of uterine/endometrial cancer, pancreatic cancer or gastric/stomach cancer.   Past Medical History:  Diagnosis Date   Allergy    Heart murmur     Past Surgical History:  Procedure Laterality Date   APPENDECTOMY     CESAREAN SECTION      Current Outpatient Medications  Medication Sig Dispense Refill   Ibuprofen (ADVIL PO) Take by mouth as needed.     Current Facility-Administered Medications  Medication Dose Route Frequency Provider Last Rate Last Admin   0.9 %  sodium chloride infusion  500 mL Intravenous Continuous Tressia Danas, MD        Allergies as of 06/12/2022 - Review Complete 06/12/2022  Allergen Reaction Noted   Naproxen Hives 09/21/2016   Penicillins  12/19/2006    Family History  Problem Relation Age of Onset   Colon cancer Paternal Grandfather    Esophageal cancer Neg Hx    Rectal cancer Neg Hx    Stomach cancer Neg Hx      Physical Exam: General:   Alert,  well-nourished, pleasant and cooperative in NAD Head:  Normocephalic and atraumatic. Eyes:  Sclera clear, no icterus.   Conjunctiva pink. Mouth:  No deformity or lesions.   Neck:  Supple; no masses or thyromegaly. Lungs:  Clear throughout to auscultation.   No wheezes. Heart:  Regular rate and rhythm; no murmurs. Abdomen:  Soft, non-tender, nondistended, normal bowel sounds, no rebound or guarding.  Msk:  Symmetrical. No boney deformities LAD: No inguinal or  umbilical LAD Extremities:  No clubbing or edema. Neurologic:  Alert and  oriented x4;  grossly nonfocal Skin:  No obvious rash or bruise. Psych:  Alert and cooperative. Normal mood and affect.     Studies/Results: No results found.    Elajah Kunsman L. Orvan Falconer, MD, MPH 06/12/2022, 8:29 AM

## 2022-06-12 NOTE — Patient Instructions (Addendum)
-   Resume previous diet. - Continue present medications. - Repeat colonoscopy in 10 years for surveillance, earlier with new symptoms. - Emerging evidence supports eating a diet of fruits, vegetables, grains, calcium, and yogurt while reducing red meat and alcohol may reduce the risk of colon cancer.  There were no polyps seen today!  You will need another screening colonoscopy in 10 years, you will receive a letter at that time when you are due for the procedure.   Please call us at [phone] if you have a change in bowel habits, change in family history of colo-rectal cancer, rectal bleeding or other GI concern before that time.  YOU HAD AN ENDOSCOPIC PROCEDURE TODAY AT THE [facility]:   Refer to the procedure report that was given to you for any specific questions about what was found during the examination.  If the procedure report does not answer your questions, please call your gastroenterologist to clarify.  If you requested that your care partner not be given the details of your procedure findings, then the procedure report has been included in a sealed envelope for you to review at your convenience later.  YOU SHOULD EXPECT: Some feelings of bloating in the abdomen. Passage of more gas than usual.  Walking can help get rid of the air that was put into your GI tract during the procedure and reduce the bloating. If you had a lower endoscopy (such as a colonoscopy or flexible sigmoidoscopy) you may notice spotting of blood in your stool or on the toilet paper. If you underwent a bowel prep for your procedure, you may not have a normal bowel movement for a few days.  Please Note:  You might notice some irritation and congestion in your nose or some drainage.  This is from the oxygen used during your procedure.  There is no need for concern and it should clear up in a day or so.  SYMPTOMS TO REPORT IMMEDIATELY:  Following lower endoscopy (colonoscopy):  Excessive amounts of  blood in the stool  Significant tenderness or worsening of abdominal pains  Swelling of the abdomen that is new, acute  Fever of 100F or higher  For urgent or emergent issues, a gastroenterologist can be reached at any hour by calling [phone]. Do not use MyChart messaging for urgent concerns.    DIET:  We do recommend a small meal at first, but then you may proceed to your regular diet.  Drink plenty of fluids but you should avoid alcoholic beverages for 24 hours.  ACTIVITY:  You should plan to take it easy for the rest of today and you should NOT DRIVE or use heavy machinery until tomorrow (because of the sedation medicines used during the test).    FOLLOW UP: Our staff will call the number listed on your records the next business day following your procedure.  We will call around 7:15- 8:00 am to check on you and address any questions or concerns that you may have regarding the information given to you following your procedure. If we do not reach you, we will leave a message.      SIGNATURES/CONFIDENTIALITY: You and/or your care partner have signed paperwork which will be entered into your electronic medical record.  These signatures attest to the fact that that the information above on your After Visit Summary has been reviewed and is understood.  Full responsibility of the confidentiality of this discharge information lies with you and/or your care-partner.

## 2022-06-12 NOTE — Op Note (Signed)
[facility] Patient Name: Michaela Castillo Procedure Date: 06/12/2022 8:34 AM MRN: [id number] Endoscopist: Tressia Danas MD, MD, [id number] Age: 46 Referring MD:  Date of Birth: [birth year] Gender: Female Account #: [id number] Procedure:                Colonoscopy Indications:              Screening for colorectal malignant neoplasm, This                            is the patient's first colonoscopy Medicines:                Monitored Anesthesia Care Procedure:                Pre-Anesthesia Assessment:                           - Prior to the procedure, a History and Physical                            was performed, and patient medications and                            allergies were reviewed. The patient's tolerance of                            previous anesthesia was also reviewed. The risks                            and benefits of the procedure and the sedation                            options and risks were discussed with the patient.                            All questions were answered, and informed consent                            was obtained. Prior Anticoagulants: The patient has                            taken no anticoagulant or antiplatelet agents. ASA                            Grade Assessment: II - A patient with mild systemic                            disease. After reviewing the risks and benefits,                            the patient was deemed in satisfactory condition to                            undergo the procedure.  After obtaining informed consent, the colonoscope                            was passed under direct vision. Throughout the                            procedure, the patient's blood pressure, pulse, and                            oxygen saturations were monitored continuously. The                            Olympus CF-HQ190L ([id number]) Colonoscope was                            introduced through  the anus and advanced to the 4                            cm into the ileum. A second forward view of the                            right colon was performed. The colonoscopy was                            performed without difficulty. The patient tolerated                            the procedure well. The quality of the bowel                            preparation was excellent. The terminal ileum,                            ileocecal valve, appendiceal orifice, and rectum                            were photographed. Scope In: 8:42:18 AM Scope Out: 8:54:58 AM Scope Withdrawal Time: 0 hours 8 minutes 19 seconds  Total Procedure Duration: 0 hours 12 minutes 40 seconds  Findings:                 The perianal and digital rectal examinations were                            normal except for small internal hemorrhoids.                           The entire examined colon appeared normal on direct                            and retroflexion views. Complications:            No immediate complications. Estimated Blood Loss:     Estimated blood loss: none. Impression:               -  The entire examined colon is normal on direct and                            retroflexion views. Recommendation:           - Patient has a contact number available for                            emergencies. The signs and symptoms of potential                            delayed complications were discussed with the                            patient. Return to normal activities tomorrow.                            Written discharge instructions were provided to the                            patient.                           - Resume previous diet.                           - Continue present medications.                           - Repeat colonoscopy in 10 years for surveillance,                            earlier with new symptoms.                           - Emerging evidence supports eating a diet of                             fruits, vegetables, grains, calcium, and yogurt                            while reducing red meat and alcohol may reduce the                            risk of colon cancer.                           - Thank you for allowing me to be involved in your                            colon cancer prevention. Tressia Danas MD, MD 06/12/2022 8:59:07 AM This report has been signed electronically.

## 2022-06-12 NOTE — Progress Notes (Signed)
Pt's states no medical or surgical changes since previsit or office visit. 

## 2022-06-12 NOTE — Progress Notes (Signed)
Report given to PACU, vss 

## 2022-06-13 ENCOUNTER — Telehealth: Payer: Self-pay | Admitting: *Deleted

## 2022-06-13 NOTE — Telephone Encounter (Signed)
Post procedure follow up call placed, no answer and left VM.  

## 2022-12-05 DIAGNOSIS — D224 Melanocytic nevi of scalp and neck: Secondary | ICD-10-CM | POA: Diagnosis not present

## 2022-12-05 DIAGNOSIS — D2221 Melanocytic nevi of right ear and external auricular canal: Secondary | ICD-10-CM | POA: Diagnosis not present

## 2022-12-05 DIAGNOSIS — D2261 Melanocytic nevi of right upper limb, including shoulder: Secondary | ICD-10-CM | POA: Diagnosis not present

## 2022-12-05 DIAGNOSIS — L811 Chloasma: Secondary | ICD-10-CM | POA: Diagnosis not present
